# Patient Record
Sex: Male | Born: 1960 | Race: White | Hispanic: No | Marital: Single | Smoking: Never smoker
Health system: Southern US, Community
[De-identification: ages and names within clinical notes are randomized; demographics above are authoritative.]

## PROBLEM LIST (undated history)

## (undated) DIAGNOSIS — F419 Anxiety disorder, unspecified: Secondary | ICD-10-CM

## (undated) DIAGNOSIS — G4730 Sleep apnea, unspecified: Secondary | ICD-10-CM

## (undated) DIAGNOSIS — D869 Sarcoidosis, unspecified: Secondary | ICD-10-CM

## (undated) DIAGNOSIS — T7840XA Allergy, unspecified, initial encounter: Secondary | ICD-10-CM

## (undated) DIAGNOSIS — A159 Respiratory tuberculosis unspecified: Secondary | ICD-10-CM

## (undated) DIAGNOSIS — I10 Essential (primary) hypertension: Secondary | ICD-10-CM

## (undated) DIAGNOSIS — K219 Gastro-esophageal reflux disease without esophagitis: Secondary | ICD-10-CM

## (undated) DIAGNOSIS — Z227 Latent tuberculosis: Secondary | ICD-10-CM

## (undated) DIAGNOSIS — J45909 Unspecified asthma, uncomplicated: Secondary | ICD-10-CM

## (undated) HISTORY — PX: TONSILLECTOMY: SUR1361

## (undated) HISTORY — DX: Allergy, unspecified, initial encounter: T78.40XA

## (undated) HISTORY — DX: Anxiety disorder, unspecified: F41.9

## (undated) HISTORY — PX: EYE SURGERY: SHX253

## (undated) HISTORY — DX: Unspecified asthma, uncomplicated: J45.909

## (undated) HISTORY — DX: Gastro-esophageal reflux disease without esophagitis: K21.9

## (undated) HISTORY — DX: Respiratory tuberculosis unspecified: A15.9

## (undated) HISTORY — PX: APPENDECTOMY: SHX54

---

## 1997-04-17 DIAGNOSIS — Z227 Latent tuberculosis: Secondary | ICD-10-CM

## 1997-04-17 HISTORY — DX: Latent tuberculosis: Z22.7

## 2004-04-13 ENCOUNTER — Ambulatory Visit: Payer: Self-pay | Admitting: Gastroenterology

## 2007-04-18 HISTORY — PX: NASAL SEPTUM SURGERY: SHX37

## 2007-04-18 HISTORY — PX: OTHER SURGICAL HISTORY: SHX169

## 2007-09-12 ENCOUNTER — Encounter: Admission: RE | Admit: 2007-09-12 | Discharge: 2007-09-12 | Payer: Self-pay | Admitting: Otolaryngology

## 2009-02-10 ENCOUNTER — Ambulatory Visit: Payer: Self-pay | Admitting: Gastroenterology

## 2010-08-25 ENCOUNTER — Ambulatory Visit: Payer: Self-pay | Admitting: Unknown Physician Specialty

## 2015-03-31 ENCOUNTER — Encounter: Payer: Self-pay | Admitting: *Deleted

## 2015-04-01 ENCOUNTER — Ambulatory Visit: Payer: 59 | Admitting: Anesthesiology

## 2015-04-01 ENCOUNTER — Encounter
Admission: RE | Disposition: A | Discharge status: Home / Self Care | Payer: Self-pay | Source: Ambulatory Visit | Attending: Unknown Physician Specialty

## 2015-04-01 ENCOUNTER — Encounter: Payer: Self-pay | Admitting: *Deleted

## 2015-04-01 ENCOUNTER — Ambulatory Visit
Admission: RE | Admit: 2015-04-01 | Discharge: 2015-04-01 | Disposition: A | Discharge status: Home / Self Care | Payer: 59 | Source: Ambulatory Visit | Attending: Unknown Physician Specialty | Admitting: Unknown Physician Specialty

## 2015-04-01 DIAGNOSIS — G4730 Sleep apnea, unspecified: Secondary | ICD-10-CM | POA: Diagnosis not present

## 2015-04-01 DIAGNOSIS — Z79899 Other long term (current) drug therapy: Secondary | ICD-10-CM | POA: Insufficient documentation

## 2015-04-01 DIAGNOSIS — Z6830 Body mass index (BMI) 30.0-30.9, adult: Secondary | ICD-10-CM | POA: Insufficient documentation

## 2015-04-01 DIAGNOSIS — K648 Other hemorrhoids: Secondary | ICD-10-CM | POA: Insufficient documentation

## 2015-04-01 DIAGNOSIS — I10 Essential (primary) hypertension: Secondary | ICD-10-CM | POA: Insufficient documentation

## 2015-04-01 DIAGNOSIS — E669 Obesity, unspecified: Secondary | ICD-10-CM | POA: Diagnosis not present

## 2015-04-01 DIAGNOSIS — Z800 Family history of malignant neoplasm of digestive organs: Secondary | ICD-10-CM | POA: Diagnosis not present

## 2015-04-01 DIAGNOSIS — Z1211 Encounter for screening for malignant neoplasm of colon: Secondary | ICD-10-CM | POA: Insufficient documentation

## 2015-04-01 DIAGNOSIS — Z86010 Personal history of colonic polyps: Secondary | ICD-10-CM | POA: Insufficient documentation

## 2015-04-01 DIAGNOSIS — Z7982 Long term (current) use of aspirin: Secondary | ICD-10-CM | POA: Insufficient documentation

## 2015-04-01 HISTORY — PX: COLONOSCOPY WITH PROPOFOL: SHX5780

## 2015-04-01 HISTORY — DX: Latent tuberculosis: Z22.7

## 2015-04-01 HISTORY — DX: Sleep apnea, unspecified: G47.30

## 2015-04-01 HISTORY — DX: Sarcoidosis, unspecified: D86.9

## 2015-04-01 HISTORY — DX: Essential (primary) hypertension: I10

## 2015-04-01 SURGERY — COLONOSCOPY WITH PROPOFOL
Anesthesia: General

## 2015-04-01 MED ORDER — PROPOFOL 10 MG/ML IV BOLUS
INTRAVENOUS | Status: DC | PRN
  Administered 2015-04-01: 70 mg via INTRAVENOUS

## 2015-04-01 MED ORDER — MIDAZOLAM HCL 5 MG/5ML IJ SOLN
INTRAMUSCULAR | Status: DC | PRN
  Administered 2015-04-01: 1 mg via INTRAVENOUS

## 2015-04-01 MED ORDER — FENTANYL CITRATE (PF) 100 MCG/2ML IJ SOLN
INTRAMUSCULAR | Status: DC | PRN
Start: 2015-04-01 — End: 2015-04-01
  Administered 2015-04-01: 50 ug via INTRAVENOUS

## 2015-04-01 MED ORDER — SODIUM CHLORIDE 0.9 % IV SOLN
INTRAVENOUS | Status: DC
  Administered 2015-04-01: 10:00:00 via INTRAVENOUS

## 2015-04-01 MED ORDER — SODIUM CHLORIDE 0.9 % IV SOLN: INTRAVENOUS | Status: DC

## 2015-04-01 MED ORDER — LIDOCAINE HCL (CARDIAC) 20 MG/ML IV SOLN
INTRAVENOUS | Status: DC | PRN
  Administered 2015-04-01: 67 mg via INTRAVENOUS

## 2015-04-01 MED ORDER — PROPOFOL 500 MG/50ML IV EMUL
INTRAVENOUS | Status: DC | PRN
  Administered 2015-04-01: 120 ug/kg/min via INTRAVENOUS

## 2015-04-01 NOTE — H&P (Signed)
Primary Care Physician:  Pcp Not In System Primary Gastroenterologist:  Dr. Vira Agar  Pre-Procedure History & Physical: HPI:  Javier PAYE Sr. is a 54 y.o. male is here for an colonoscopy.   Past Medical History  Diagnosis Date  . Sleep apnea   . 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 Inactive TB 1999  . Hypertension     Past Surgical History  Procedure Laterality Date  . Tonsillectomy    . Appendectomy    . Pillar procedure  2009  . Nasal septum surgery  2009    Prior to Admission medications   Medication Sig Start Date End Date Taking? Authorizing Provider  albuterol (PROVENTIL HFA;VENTOLIN HFA) 108 (90 BASE) MCG/ACT inhaler Inhale into the lungs every 6 (six) hours as needed for wheezing or shortness of breath.   Yes Historical Provider, MD  aspirin 81 MG tablet Take 81 mg by mouth daily.   Yes Historical Provider, MD  buPROPion (WELLBUTRIN SR) 150 MG 12 hr tablet Take 150 mg by mouth daily.   Yes Historical Provider, MD  clorazepate (TRANXENE) 3.75 MG tablet Take 3.75 mg by mouth 2 (two) times daily as needed for anxiety.   Yes Historical Provider, MD  dicyclomine (BENTYL) 20 MG tablet Take 20 mg by mouth every 6 (six) hours.   Yes Historical Provider, MD  diphenoxylate-atropine (LOMOTIL) 2.5-0.025 MG tablet Take by mouth 4 (four) times daily as needed for diarrhea or loose stools.   Yes Historical Provider, MD  finasteride (PROPECIA) 1 MG tablet Take 1 mg by mouth daily.   Yes Historical Provider, MD  fluticasone (FLONASE) 50 MCG/ACT nasal spray Place 2 sprays into both nostrils daily.   Yes Historical Provider, MD  hyoscyamine (ANASPAZ) 0.125 MG TBDP disintergrating tablet Place 0.125 mg under the tongue every 6 (six) hours as needed.   Yes Historical Provider, MD  losartan-hydrochlorothiazide (HYZAAR) 100-25 MG tablet Take 1 tablet by mouth daily.   Yes Historical Provider, MD  methscopolamine (PAMINE FORTE) 5 MG tablet Take 5 mg by mouth at bedtime.   Yes Historical  Provider, MD  omeprazole (PRILOSEC) 10 MG capsule Take 10 mg by mouth daily.   Yes Historical Provider, MD  tadalafil (CIALIS) 5 MG tablet Take 5 mg by mouth daily as needed for erectile dysfunction.   Yes Historical Provider, MD  testosterone cypionate (DEPOTESTOSTERONE CYPIONATE) 200 MG/ML injection Inject into the muscle every 14 (fourteen) days.   Yes Historical Provider, MD    Allergies as of 03/09/2015  . (Not on File)    History reviewed. No pertinent family history.  Social History   Social History  . Marital Status: Single    Spouse Name: N/A  . Number of Children: N/A  . Years of Education: N/A   Occupational History  . Not on file.   Social History Main Topics  . Smoking status: Never Smoker   . Smokeless tobacco: Never Used  . Alcohol Use: Yes  . Drug Use: No  . Sexual Activity: Not on file   Other Topics Concern  . Not on file   Social History Narrative    Review of Systems: See HPI, otherwise negative ROS  Physical Exam: BP 136/91 mmHg  Pulse 88  Temp(Src) 98.6 F (37 C) (Tympanic)  Resp 20  Ht 6' (1.829 m)  Wt 102.059 kg (225 lb)  BMI 30.51 kg/m2  SpO2 98% General:   Alert,  pleasant and cooperative in NAD Head:  Normocephalic and atraumatic. Neck:  Supple; no masses  or thyromegaly. Lungs:  Clear throughout to auscultation.    Heart:  Regular rate and rhythm. Abdomen:  Soft, nontender and nondistended. Normal bowel sounds, without guarding, and without rebound.   Neurologic:  Alert and  oriented x4;  grossly normal neurologically.  Impression/Plan: Javier Levy Sr. is here for an colonoscopy to be performed for [facility] colon polyps and FH colon cancer in father  Risks, benefits, limitations, and alternatives regarding  colonoscopy have been reviewed with the patient.  Questions have been answered.  All parties agreeable.   Gaylyn Cheers, MD  04/01/2015, 11:01 AM

## 2015-04-01 NOTE — Anesthesia Preprocedure Evaluation (Signed)
Anesthesia Evaluation  Patient identified by MRN, date of birth, ID band Patient awake    Reviewed: Allergy & Precautions, NPO status , Patient's Chart, lab work & pertinent test results  Airway Mallampati: II       Dental  (+) Teeth Intact   Pulmonary sleep apnea and Continuous Positive Airway Pressure Ventilation ,    breath sounds clear to auscultation       Cardiovascular Exercise Tolerance: Good hypertension, Pt. on medications  Rhythm:Regular     Neuro/Psych negative neurological ROS     GI/Hepatic negative GI ROS, Neg liver ROS,   Endo/Other    Renal/GU negative Renal ROS     Musculoskeletal   Abdominal (+) + obese,   Peds  Hematology negative hematology ROS (+)   Anesthesia Other Findings   Reproductive/Obstetrics                             Anesthesia Physical Anesthesia Plan  ASA: II  Anesthesia Plan: General   Post-op Pain Management:    Induction: Intravenous  Airway Management Planned: Nasal Cannula  Additional Equipment:   Intra-op Plan:   Post-operative Plan:   Informed Consent: I have reviewed the patients History and Physical, chart, labs and discussed the procedure including the risks, benefits and alternatives for the proposed anesthesia with the patient or authorized representative who has indicated his/her understanding and acceptance.     Plan Discussed with: Surgeon  Anesthesia Plan Comments:         Anesthesia Quick Evaluation

## 2015-04-01 NOTE — Transfer of Care (Signed)
Immediate Anesthesia Transfer of Care Note  Patient: Javier HEFFINGTON Sr.  Procedure(s) Performed: Procedure(s): COLONOSCOPY WITH PROPOFOL (N/A)  Patient Location: PACU  Anesthesia Type:General  Level of Consciousness: sedated  Airway & Oxygen Therapy: Patient Spontanous Breathing and Patient connected to nasal cannula oxygen  Post-op Assessment: Report given to RN and Post -op Vital signs reviewed and stable  Post vital signs: Reviewed and stable  Last Vitals:  Filed Vitals:   04/01/15 1130 04/01/15 1131  BP: 92/47 92/47  Pulse: 64 67  Temp: 35.6 C 35.6 C  Resp: 17 15    Complications: No apparent anesthesia complications

## 2015-04-01 NOTE — Anesthesia Postprocedure Evaluation (Signed)
Anesthesia Post Note  Patient: Javier Martin.  Procedure(s) Performed: Procedure(s) (LRB): COLONOSCOPY WITH PROPOFOL (N/A)  Patient location during evaluation: PACU Anesthesia Type: General Level of consciousness: awake Pain management: satisfactory to patient Vital Signs Assessment: post-procedure vital signs reviewed and stable Respiratory status: nonlabored ventilation Cardiovascular status: stable Anesthetic complications: no    Last Vitals:  Filed Vitals:   04/01/15 1131 04/01/15 1140  BP: 92/47 104/76  Pulse: 67 75  Temp: 35.6 C   Resp: 15 18    Last Pain: There were no vitals filed for this visit.               VAN STAVEREN,Rani Idler

## 2015-04-01 NOTE — Op Note (Signed)
[facility] Patient Name: Javier Martin Procedure Date: 04/01/2015 11:02 AM MRN: [id number] Account #: [id number] Date of Birth: 1960/09/16 Admit Type: Outpatient Age: 54 Room: [facility] ENDO ROOM 1 Gender: Male Note Status: Finalized Procedure:         Colonoscopy Indications:       High risk colon cancer surveillance: Personal history of                     colonic polyps, Family history of colon cancer in a                     first-degree relative Providers:         Manya Silvas, MD Referring MD:      Janit Pagan, MD (Referring MD) Medicines:         Propofol per Anesthesia Complications:     No immediate complications. Procedure:         Pre-Anesthesia Assessment:                    - After reviewing the risks and benefits, the patient was                     deemed in satisfactory condition to undergo the procedure.                    After obtaining informed consent, the colonoscope was                     passed under direct vision. Throughout the procedure, the                     patient's blood pressure, pulse, and oxygen saturations                     were monitored continuously. The Colonoscope was                     introduced through the anus and advanced to the the cecum,                     identified by appendiceal orifice and ileocecal valve. The                     colonoscopy was performed without difficulty. The patient                     tolerated the procedure well. The quality of the bowel                     preparation was excellent. Findings:      Internal hemorrhoids were found during endoscopy. The hemorrhoids were       small and Grade I (internal hemorrhoids that do not prolapse).      The exam was otherwise without abnormality. Impression:        - Internal hemorrhoids.                    - The examination was otherwise normal.                    - No specimens collected. Recommendation:    - Repeat  colonoscopy in 5 years for screening purposes. Manya Silvas, MD 04/01/2015 11:29:41 AM This report has been signed electronically. Number  of Addenda: 0 Note Initiated On: 04/01/2015 11:02 AM Scope Withdrawal Time: 0 hours 8 minutes 5 seconds  Total Procedure Duration: 0 hours 14 minutes 10 seconds       [facility]

## 2015-04-03 ENCOUNTER — Encounter: Payer: Self-pay | Admitting: Unknown Physician Specialty

## 2019-02-13 DIAGNOSIS — D239 Other benign neoplasm of skin, unspecified: Secondary | ICD-10-CM

## 2019-02-13 HISTORY — DX: Other benign neoplasm of skin, unspecified: D23.9

## 2020-02-25 ENCOUNTER — Ambulatory Visit (INDEPENDENT_AMBULATORY_CARE_PROVIDER_SITE_OTHER): Payer: 59 | Admitting: Dermatology

## 2020-02-25 ENCOUNTER — Encounter: Payer: Self-pay | Admitting: Dermatology

## 2020-02-25 ENCOUNTER — Other Ambulatory Visit: Payer: Self-pay

## 2020-02-25 DIAGNOSIS — D225 Melanocytic nevi of trunk: Secondary | ICD-10-CM

## 2020-02-25 DIAGNOSIS — L730 Acne keloid: Secondary | ICD-10-CM | POA: Diagnosis not present

## 2020-02-25 DIAGNOSIS — Z1283 Encounter for screening for malignant neoplasm of skin: Secondary | ICD-10-CM | POA: Diagnosis not present

## 2020-02-25 DIAGNOSIS — L821 Other seborrheic keratosis: Secondary | ICD-10-CM

## 2020-02-25 DIAGNOSIS — Z86018 Personal history of other benign neoplasm: Secondary | ICD-10-CM | POA: Diagnosis not present

## 2020-02-25 DIAGNOSIS — L578 Other skin changes due to chronic exposure to nonionizing radiation: Secondary | ICD-10-CM

## 2020-02-25 DIAGNOSIS — D492 Neoplasm of unspecified behavior of bone, soft tissue, and skin: Secondary | ICD-10-CM

## 2020-02-25 DIAGNOSIS — L820 Inflamed seborrheic keratosis: Secondary | ICD-10-CM

## 2020-02-25 NOTE — Progress Notes (Signed)
Follow-Up Visit   Subjective  Javier Martin. is a 59 y.o. male who presents for the following: Rash (Pt c/o rash on the posterior neck for several years, treating with Clindamycin lotion with a good response ) and Follow-up (hx of Dyplastic nevus Left low back, 11cm lat. to spine. Mild atypia, removed in 2020 ).  He has an irritating spot on his leg he would like treated.  The following portions of the chart were reviewed this encounter and updated as appropriate:  Tobacco  Allergies  Meds  Problems  Med Hx  Surg Hx  Fam Hx     Review of Systems:  No other skin or systemic complaints except as noted in HPI or Assessment and Plan.  Objective  Well appearing patient in no apparent distress; mood and affect are within normal limits.  A focused examination was performed including face,chest,back,neck, legs . Relevant physical exam findings are noted in the Assessment and Plan.  Objective  Neck - Posterior: Erythema and minimal papules   Objective  Left lat pectoral: 0.6 cm irregular brown macule   Objective  Left calf: Erythematous keratotic or waxy stuck-on papule or plaque.   Objective  Left low back, 11cm lat. to spine.: Scar with no evidence of recurrence.    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 Cont Clindamycin solution prn   Neoplasm of skin Left lat pectoral  Epidermal / dermal shaving  Lesion diameter (cm):  0.6 Informed consent: discussed and consent obtained   Timeout: patient name, date of birth, surgical site, and procedure verified   Procedure prep:  Patient was prepped and draped in usual sterile fashion Prep type:  Isopropyl alcohol Anesthesia: the lesion was anesthetized in a standard fashion   Anesthetic:  1% lidocaine w/ epinephrine 1-100,000 buffered w/ 8.4% NaHCO3 Hemostasis achieved with: pressure, aluminum chloride and electrodesiccation     Outcome: patient tolerated procedure well   Post-procedure details: sterile dressing applied and wound care instructions given   Dressing type: bandage and petrolatum    Specimen 1 - Surgical pathology Differential Diagnosis: R/O Dysplastic nevus  Check Margins: No 0.6 cm irregular brown macule  Inflamed seborrheic keratosis Left calf  Destruction of lesion - Left calf Complexity: simple   Destruction method: cryotherapy   Informed consent: discussed and consent obtained   Timeout:  patient name, date of birth, surgical site, and procedure verified Lesion destroyed using liquid nitrogen: Yes   Region frozen until ice ball extended beyond lesion: Yes   Outcome: patient tolerated procedure well with no complications   Post-procedure details: wound care instructions given    History of dysplastic nevus Left low back, 11cm lat. to spine. Clear. Observe for recurrence. Call clinic for new or changing lesions.  Recommend regular skin exams, daily broad-spectrum spf 30+ sunscreen use, and photoprotection.    Actinic Damage - chronic, secondary to cumulative UV radiation exposure/sun exposure over time - diffuse scaly erythematous macules with underlying dyspigmentation - Recommend daily broad spectrum sunscreen SPF 30+ to sun-exposed areas, reapply every 2 hours as needed.  - Call for new or changing lesions.  Seborrheic Keratoses - Stuck-on, waxy, tan-brown papules and plaques  - Discussed benign etiology and prognosis. - Observe - Call for any changes  Return in about 1 year (around 02/24/2021).  Marta Lamas, CMA, am acting as scribe for Sarina Ser, MD .  Documentation: I have reviewed the above documentation  for accuracy and completeness, and I agree with the above.  Sarina Ser, MD

## 2020-02-25 NOTE — Patient Instructions (Signed)

## 2020-03-02 ENCOUNTER — Telehealth: Payer: Self-pay

## 2020-03-02 NOTE — Telephone Encounter (Signed)
LM on VM please return my call  

## 2020-03-02 NOTE — Telephone Encounter (Signed)
Pt informed of results. He has no concerns.

## 2020-03-02 NOTE — Telephone Encounter (Signed)
-----   Message from Ralene Bathe, MD sent at 03/01/2020  5:38 PM EST ----- Diagnosis Skin , left lat pectoral MELANOCYTIC NEVUS, COMPOUND TYPE  Benign mole

## 2020-05-19 DIAGNOSIS — R7989 Other specified abnormal findings of blood chemistry: Secondary | ICD-10-CM | POA: Insufficient documentation

## 2020-07-13 DIAGNOSIS — K589 Irritable bowel syndrome without diarrhea: Secondary | ICD-10-CM | POA: Insufficient documentation

## 2020-07-13 DIAGNOSIS — G4730 Sleep apnea, unspecified: Secondary | ICD-10-CM | POA: Insufficient documentation

## 2020-07-13 DIAGNOSIS — D869 Sarcoidosis, unspecified: Secondary | ICD-10-CM | POA: Insufficient documentation

## 2020-07-13 DIAGNOSIS — Z227 Latent tuberculosis: Secondary | ICD-10-CM | POA: Insufficient documentation

## 2020-07-13 DIAGNOSIS — F418 Other specified anxiety disorders: Secondary | ICD-10-CM | POA: Insufficient documentation

## 2020-07-13 DIAGNOSIS — K219 Gastro-esophageal reflux disease without esophagitis: Secondary | ICD-10-CM | POA: Insufficient documentation

## 2020-07-13 DIAGNOSIS — N486 Induration penis plastica: Secondary | ICD-10-CM | POA: Insufficient documentation

## 2020-07-13 DIAGNOSIS — Z6831 Body mass index (BMI) 31.0-31.9, adult: Secondary | ICD-10-CM | POA: Insufficient documentation

## 2020-07-13 DIAGNOSIS — R5382 Chronic fatigue, unspecified: Secondary | ICD-10-CM | POA: Insufficient documentation

## 2020-07-13 DIAGNOSIS — G4733 Obstructive sleep apnea (adult) (pediatric): Secondary | ICD-10-CM | POA: Insufficient documentation

## 2020-07-13 DIAGNOSIS — E291 Testicular hypofunction: Secondary | ICD-10-CM | POA: Insufficient documentation

## 2020-07-13 DIAGNOSIS — I10 Essential (primary) hypertension: Secondary | ICD-10-CM | POA: Insufficient documentation

## 2020-07-13 DIAGNOSIS — N529 Male erectile dysfunction, unspecified: Secondary | ICD-10-CM | POA: Insufficient documentation

## 2020-10-11 DIAGNOSIS — Z8639 Personal history of other endocrine, nutritional and metabolic disease: Secondary | ICD-10-CM | POA: Insufficient documentation

## 2020-10-11 DIAGNOSIS — J45909 Unspecified asthma, uncomplicated: Secondary | ICD-10-CM | POA: Insufficient documentation

## 2021-03-03 ENCOUNTER — Ambulatory Visit (INDEPENDENT_AMBULATORY_CARE_PROVIDER_SITE_OTHER): Payer: 59 | Admitting: Dermatology

## 2021-03-03 ENCOUNTER — Other Ambulatory Visit: Payer: Self-pay

## 2021-03-03 DIAGNOSIS — L821 Other seborrheic keratosis: Secondary | ICD-10-CM

## 2021-03-03 DIAGNOSIS — L578 Other skin changes due to chronic exposure to nonionizing radiation: Secondary | ICD-10-CM | POA: Diagnosis not present

## 2021-03-03 DIAGNOSIS — L814 Other melanin hyperpigmentation: Secondary | ICD-10-CM

## 2021-03-03 DIAGNOSIS — Z86018 Personal history of other benign neoplasm: Secondary | ICD-10-CM

## 2021-03-03 DIAGNOSIS — Z1283 Encounter for screening for malignant neoplasm of skin: Secondary | ICD-10-CM

## 2021-03-03 DIAGNOSIS — D489 Neoplasm of uncertain behavior, unspecified: Secondary | ICD-10-CM

## 2021-03-03 DIAGNOSIS — L820 Inflamed seborrheic keratosis: Secondary | ICD-10-CM

## 2021-03-03 DIAGNOSIS — L570 Actinic keratosis: Secondary | ICD-10-CM | POA: Diagnosis not present

## 2021-03-03 DIAGNOSIS — D2239 Melanocytic nevi of other parts of face: Secondary | ICD-10-CM | POA: Diagnosis not present

## 2021-03-03 DIAGNOSIS — L730 Acne keloid: Secondary | ICD-10-CM | POA: Diagnosis not present

## 2021-03-03 DIAGNOSIS — D1801 Hemangioma of skin and subcutaneous tissue: Secondary | ICD-10-CM

## 2021-03-03 MED ORDER — CLINDAMYCIN PHOSPHATE 1 % EX SWAB: 1.0000 "application " | CUTANEOUS | 11 refills | Status: DC

## 2021-03-03 NOTE — Progress Notes (Signed)
Follow-Up Visit   Subjective  Javier Martin. is a 60 y.o. male who presents for the following: Follow-up (Patient here today for 1 year tbse. Patient reports 2 spots on right leg and 1 spot on left leg. Patient is also here today for follow up on acne keloidalis nuchae at neck. ).  The following portions of the chart were reviewed this encounter and updated as appropriate:  Tobacco  Allergies  Meds  Problems  Med Hx  Surg Hx  Fam Hx     Review of Systems: No other skin or systemic complaints except as noted in HPI or Assessment and Plan.  Objective  Well appearing patient in no apparent distress; mood and affect are within normal limits.  A full examination was performed including scalp, head, eyes, ears, nose, lips, neck, chest, axillae, abdomen, back, buttocks, bilateral upper extremities, bilateral lower extremities, hands, feet, fingers, toes, fingernails, and toenails. All findings within normal limits unless otherwise noted below.  Neck - Posterior Erythema and minimal papules   Left Forehead 3 cm to superior brow 0.6 cm flesh colored papule        legs x 5 (5) Erythematous keratotic or waxy stuck-on papule or plaque.   face x 6 (6) Erythematous thin papules/macules with gritty scale.   Assessment & Plan  Acne keloidalis nuchae Neck - Posterior chronic but improved on treatment chronic AKN improved but not to goal Cont Clindamycin solution prn   clindamycin (CLEOCIN T) 1 % SWAB - Neck - Posterior Apply 1 application topically See admin instructions. 1 - 2 times daily prn for back of neck  Neoplasm of uncertain behavior Left Forehead 3 cm to superior brow Epidermal / dermal shaving  Lesion diameter (cm):  0.6 Informed consent: discussed and consent obtained   Timeout: patient name, date of birth, surgical site, and procedure verified   Procedure prep:  Patient was prepped and draped in usual sterile fashion Prep type:  Isopropyl  alcohol Anesthesia: the lesion was anesthetized in a standard fashion   Anesthetic:  1% lidocaine w/ epinephrine 1-100,000 buffered w/ 8.4% NaHCO3 Instrument used: flexible razor blade   Hemostasis achieved with: pressure, aluminum chloride and electrodesiccation   Outcome: patient tolerated procedure well   Post-procedure details: sterile dressing applied and wound care instructions given   Dressing type: bandage and petrolatum    Specimen 1 - Surgical pathology Differential Diagnosis: R/o sebaceous hyperplasia vs nevus vs bcc Check Margins: No R/o sebaceous hyperplasia vs nevus vs bcc  Inflamed seborrheic keratosis legs x  Destruction of lesion - legs x 5 Complexity: simple   Destruction method: cryotherapy   Informed consent: discussed and consent obtained   Timeout:  patient name, date of birth, surgical site, and procedure verified Lesion destroyed using liquid nitrogen: Yes   Region frozen until ice ball extended beyond lesion: Yes   Outcome: patient tolerated procedure well with no complications   Post-procedure details: wound care instructions given   Additional details:  Prior to procedure, discussed risks of blister formation, small wound, skin dyspigmentation, or rare scar following cryotherapy. Recommend Vaseline ointment to treated areas while healing.  Actinic keratosis (6) face x 6 Actinic keratoses are precancerous spots that appear secondary to cumulative UV radiation exposure/sun exposure over time. They are chronic with expected duration over 1 year. A portion of actinic keratoses will progress to squamous cell carcinoma of the skin. It is not possible to reliably predict which spots will progress to skin cancer and so  treatment is recommended to prevent development of skin cancer.  Recommend daily broad spectrum sunscreen SPF 30+ to sun-exposed areas, reapply every 2 hours as needed.  Recommend staying in the shade or wearing long sleeves, sun glasses (UVA+UVB  protection) and wide brim hats (4-inch brim around the entire circumference of the hat). Call for new or changing lesions.  Destruction of lesion - face x 6 Complexity: simple   Destruction method: cryotherapy   Informed consent: discussed and consent obtained   Timeout:  patient name, date of birth, surgical site, and procedure verified Lesion destroyed using liquid nitrogen: Yes   Region frozen until ice ball extended beyond lesion: Yes   Outcome: patient tolerated procedure well with no complications   Post-procedure details: wound care instructions given   Additional details:  Prior to procedure, discussed risks of blister formation, small wound, skin dyspigmentation, or rare scar following cryotherapy. Recommend Vaseline ointment to treated areas while healing.  Skin cancer screening  Lentigines - Scattered tan macules - Due to sun exposure - Benign-appearing, observe - Recommend daily broad spectrum sunscreen SPF 30+ to sun-exposed areas, reapply every 2 hours as needed. - Call for any changes  Seborrheic Keratoses - Stuck-on, waxy, tan-brown papules and/or plaques  - Benign-appearing - Discussed benign etiology and prognosis. - Observe - Call for any changes  Melanocytic Nevi - Tan-brown and/or pink-flesh-colored symmetric macules and papules - Benign appearing on exam today - Observation - Call clinic for new or changing moles - Recommend daily use of broad spectrum spf 30+ sunscreen to sun-exposed areas.   Hemangiomas - Red papules - Discussed benign nature - Observe - Call for any changes  Actinic Damage - Chronic condition, secondary to cumulative UV/sun exposure - diffuse scaly erythematous macules with underlying dyspigmentation - Recommend daily broad spectrum sunscreen SPF 30+ to sun-exposed areas, reapply every 2 hours as needed.  - Staying in the shade or wearing long sleeves, sun glasses (UVA+UVB protection) and wide brim hats (4-inch brim around the  entire circumference of the hat) are also recommended for sun protection.  - Call for new or changing lesions.  History of Dysplastic Nevi - No evidence of recurrence today Left low back, 11cm lat. to spine. - Recommend regular full body skin exams - Recommend daily broad spectrum sunscreen SPF 30+ to sun-exposed areas, reapply every 2 hours as needed.  - Call if any new or changing lesions are noted between office visits  Skin cancer screening performed today.  Return for 1 year tbse . IRuthell Rummage, CMA, am acting as scribe for Sarina Ser, MD. Documentation: I have reviewed the above documentation for accuracy and completeness, and I agree with the above.  Sarina Ser, MD

## 2021-03-03 NOTE — Patient Instructions (Addendum)
Biopsy Wound Care Instructions  Leave the original bandage on for 24 hours if possible.  If the bandage becomes soaked or soiled before that time, it is OK to remove it and examine the wound.  A small amount of post-operative bleeding is normal.  If excessive bleeding occurs, remove the bandage, place gauze over the site and apply continuous pressure (no peeking) over the area for 30 minutes. If this does not work, please call our clinic as soon as possible or page your doctor if it is after hours.   Once a day, cleanse the wound with soap and water. It is fine to shower. If a thick crust develops you may use a Q-tip dipped into dilute hydrogen peroxide (mix 1:1 with water) to dissolve it.  Hydrogen peroxide can slow the healing process, so use it only as needed.    After washing, apply petroleum jelly (Vaseline) or an antibiotic ointment if your doctor prescribed one for you, followed by a bandage.    For best healing, the wound should be covered with a layer of ointment at all times. If you are not able to keep the area covered with a bandage to hold the ointment in place, this may mean re-applying the ointment several times a day.  Continue this wound care until the wound has healed and is no longer open.   Itching and mild discomfort is normal during the healing process. However, if you develop pain or severe itching, please call our office.   If you have any discomfort, you can take Tylenol (acetaminophen) or ibuprofen as directed on the bottle. (Please do not take these if you have an allergy to them or cannot take them for another reason).  Some redness, tenderness and white or yellow material in the wound is normal healing.  If the area becomes very sore and red, or develops a thick yellow-green material (pus), it may be infected; please notify us.    If you have stitches, return to clinic as directed to have the stitches removed. You will continue wound care for 2-3 days after the stitches  are removed.   Wound healing continues for up to one year following surgery. It is not unusual to experience pain in the scar from time to time during the interval.  If the pain becomes severe or the scar thickens, you should notify the office.    A slight amount of redness in a scar is expected for the first six months.  After six months, the redness will fade and the scar will soften and fade.  The color difference becomes less noticeable with time.  If there are any problems, return for a post-op surgery check at your earliest convenience.  To improve the appearance of the scar, you can use silicone scar gel, cream, or sheets (such as Mederma or Serica) every night for up to one year. These are available over the counter (without a prescription).  Please call our office at [phone] for any questions or concerns.   Actinic keratoses are precancerous spots that appear secondary to cumulative UV radiation exposure/sun exposure over time. They are chronic with expected duration over 1 year. A portion of actinic keratoses will progress to squamous cell carcinoma of the skin. It is not possible to reliably predict which spots will progress to skin cancer and so treatment is recommended to prevent development of skin cancer.  Recommend daily broad spectrum sunscreen SPF 30+ to sun-exposed areas, reapply every 2 hours as needed.  Recommend  staying in the shade or wearing long sleeves, sun glasses (UVA+UVB protection) and wide brim hats (4-inch brim around the entire circumference of the hat). Call for new or changing lesions.   Cryotherapy Aftercare  Wash gently with soap and water everyday.   Apply Vaseline and Band-Aid daily until healed.          Melanoma ABCDEs  Melanoma is the most dangerous type of skin cancer, and is the leading cause of death from skin disease.  You are more likely to develop melanoma if you: Have light-colored skin, light-colored eyes, or red or blond  hair Spend a lot of time in the sun Tan regularly, either outdoors or in a tanning bed Have had blistering sunburns, especially during childhood Have a close family member who has had a melanoma Have atypical moles or large birthmarks  Early detection of melanoma is key since treatment is typically straightforward and cure rates are extremely high if we catch it early.   The first sign of melanoma is often a change in a mole or a new dark spot.  The ABCDE system is a way of remembering the signs of melanoma.  A for asymmetry:  The two halves do not match. B for border:  The edges of the growth are irregular. C for color:  A mixture of colors are present instead of an even brown color. D for diameter:  Melanomas are usually (but not always) greater than 54mm - the size of a pencil eraser. E for evolution:  The spot keeps changing in size, shape, and color.  Please check your skin once per month between visits. You can use a small mirror in front and a large mirror behind you to keep an eye on the back side or your body.   If you see any new or changing lesions before your next follow-up, please call to schedule a visit.  Please continue daily skin protection including broad spectrum sunscreen SPF 30+ to sun-exposed areas, reapplying every 2 hours as needed when you're outdoors.   Staying in the shade or wearing long sleeves, sun glasses (UVA+UVB protection) and wide brim hats (4-inch brim around the entire circumference of the hat) are also recommended for sun protection.    If You Need Anything After Your Visit  If you have any questions or concerns for your doctor, please call our main line at [phone] and press option 4 to reach your doctor's medical assistant. If no one answers, please leave a voicemail as directed and we will return your call as soon as possible. Messages left after 4 pm will be answered the following business day.   You may also send [place] a message via [place]. We  typically respond to MyChart messages within 1-2 business days.  For prescription refills, please ask your pharmacy to contact our office. Our fax number is [phone].  If you have an urgent issue when the clinic is closed that cannot wait until the next business day, you can page your doctor at the number below.    Please note that while we do our best to be available for urgent issues outside of office hours, we are not available 24/7.   If you have an urgent issue and are unable to reach [place], you may choose to seek medical care at your doctor's office, retail clinic, urgent care center, or emergency room.  If you have a medical emergency, please immediately call 911 or go to the emergency department.  Pager Numbers  -  Dr. Nehemiah Massed: [phone]  - Dr. Laurence Ferrari: [phone]  - Dr. Nicole Kindred: [phone]  In the event of inclement weather, please call our main line at [phone] for an update on the status of any delays or closures.  Dermatology Medication Tips: Please keep the boxes that topical medications come in in order to help keep track of the instructions about where and how to use these. Pharmacies typically print the medication instructions only on the boxes and not directly on the medication tubes.   If your medication is too expensive, please contact our office at [phone] option 4 or send [place] a message through [place].   We are unable to tell what your co-pay for medications will be in advance as this is different depending on your insurance coverage. However, we may be able to find a substitute medication at lower cost or fill out paperwork to get insurance to cover a needed medication.   If a prior authorization is required to get your medication covered by your insurance company, please allow [place] 1-2 business days to complete this process.  Drug prices often vary depending on where the prescription is filled and some pharmacies may offer cheaper prices.  The  website www.goodrx.com contains coupons for medications through different pharmacies. The prices here do not account for what the cost may be with help from insurance (it may be cheaper with your insurance), but the website can give you the price if you did not use any insurance.  - You can print the associated coupon and take it with your prescription to the pharmacy.  - You may also stop by our office during regular business hours and pick up a GoodRx coupon card.  - If you need your prescription sent electronically to a different pharmacy, notify our office through [facility] or by phone at [phone] option 4.     Si Usted Necesita Algo Despus de Su Visita  Tambin puede enviarnos un mensaje a travs de Pharmacist, community. Por lo general respondemos a los mensajes de MyChart en el transcurso de 1 a 2 das hbiles.  Para renovar recetas, por favor pida a su farmacia que se ponga en contacto con nuestra oficina. Harland Dingwall de fax es [place] [phone].  Si tiene un asunto urgente cuando la clnica est cerrada y que no puede esperar hasta el siguiente da hbil, puede llamar/localizar a su doctor(a) al nmero que aparece a continuacin.   Por favor, tenga en cuenta que aunque hacemos todo lo posible para estar disponibles para asuntos urgentes fuera del horario de [place], no estamos disponibles las 24 horas del da, los 7 das de la [place].   Si tiene un problema urgente y no puede comunicarse con nosotros, puede optar por buscar atencin mdica  en el consultorio de su doctor(a), en una clnica privada, en un centro de atencin urgente o en una sala de emergencias.  Si tiene Engineering geologist, por favor llame inmediatamente al 911 o vaya a la sala de emergencias.  Nmeros de bper  - Dr. Nehemiah Massed: [phone]  - Dra. Moye: [phone]  - Dra. Nicole Kindred: [phone]  En caso de inclemencias del [place], por favor llame a Johnsie Kindred principal al [phone] para una  actualizacin sobre el [place] de cualquier retraso o cierre.  Consejos para la medicacin en dermatologa: Por favor, guarde las cajas en las que vienen los medicamentos de uso tpico para ayudarle a seguir las instrucciones sobre dnde y cmo usarlos. [place] instrucciones del medicamento  slo en las cajas y no directamente en los tubos del medicamento.   Si su medicamento es muy caro, por favor, pngase en contacto con Zigmund Daniel llamando al [phone] y presione la opcin 4 o envenos un mensaje a travs de Pharmacist, community.   No podemos decirle cul ser su copago por los medicamentos por adelantado ya que esto es diferente dependiendo de la cobertura de su seguro. Sin embargo, es posible que podamos encontrar un medicamento sustituto a Electrical engineer un formulario para que el seguro cubra el medicamento que se considera necesario.   Si se requiere una autorizacin previa para que su compaa de seguros [place] su medicamento, por favor permtanos de 1 a 2 das hbiles para completar este proceso.  Los precios de los medicamentos varan con frecuencia dependiendo del Environmental consultant de dnde se surte la receta y alguna farmacias pueden ofrecer precios ms baratos.  El sitio web www.goodrx.com tiene cupones para medicamentos de Airline pilot. Los precios aqu no tienen en cuenta lo que podra costar con la ayuda del seguro (puede ser ms barato con su seguro), pero el sitio web puede darle el precio si no utiliz Research scientist (physical sciences).  - Puede imprimir el cupn correspondiente y llevarlo con su receta a la farmacia.  - Tambin puede pasar por nuestra oficina durante el horario de atencin regular y Charity fundraiser una tarjeta de cupones de GoodRx.  - Si necesita que su receta se enve electrnicamente a una farmacia diferente, informe a nuestra oficina a travs de MyChart de [place] o por telfono llamando al [phone] y presione la opcin 4.

## 2021-03-07 ENCOUNTER — Telehealth: Payer: Self-pay

## 2021-03-07 NOTE — Telephone Encounter (Signed)
Advised patient of results/hd  

## 2021-03-07 NOTE — Telephone Encounter (Signed)
-----   Message from Ralene Bathe, MD sent at 03/05/2021  2:41 PM EST ----- Diagnosis Skin , left forehead 3 cm to superior brow MELANOCYTIC NEVUS, INTRADERMAL TYPE, IRRITATED  Benign irritated mole No further treatment needed

## 2021-03-18 ENCOUNTER — Encounter: Payer: Self-pay | Admitting: Dermatology

## 2021-08-17 ENCOUNTER — Ambulatory Visit
Admission: RE | Admit: 2021-08-17 | Discharge: 2021-08-17 | Disposition: A | Discharge status: Home / Self Care | Payer: 59 | Attending: Family Medicine | Admitting: Family Medicine

## 2021-08-17 ENCOUNTER — Ambulatory Visit
Admission: RE | Admit: 2021-08-17 | Discharge: 2021-08-17 | Disposition: A | Discharge status: Home / Self Care | Payer: 59 | Source: Ambulatory Visit | Attending: Family Medicine | Admitting: Family Medicine

## 2021-08-17 ENCOUNTER — Other Ambulatory Visit: Payer: Self-pay | Admitting: Family Medicine

## 2021-08-17 DIAGNOSIS — R0602 Shortness of breath: Secondary | ICD-10-CM

## 2022-03-07 ENCOUNTER — Ambulatory Visit (INDEPENDENT_AMBULATORY_CARE_PROVIDER_SITE_OTHER): Payer: 59 | Admitting: Dermatology

## 2022-03-07 DIAGNOSIS — C44319 Basal cell carcinoma of skin of other parts of face: Secondary | ICD-10-CM | POA: Diagnosis not present

## 2022-03-07 DIAGNOSIS — L814 Other melanin hyperpigmentation: Secondary | ICD-10-CM | POA: Diagnosis not present

## 2022-03-07 DIAGNOSIS — Z86018 Personal history of other benign neoplasm: Secondary | ICD-10-CM

## 2022-03-07 DIAGNOSIS — L821 Other seborrheic keratosis: Secondary | ICD-10-CM

## 2022-03-07 DIAGNOSIS — D485 Neoplasm of uncertain behavior of skin: Secondary | ICD-10-CM

## 2022-03-07 DIAGNOSIS — L570 Actinic keratosis: Secondary | ICD-10-CM

## 2022-03-07 DIAGNOSIS — L578 Other skin changes due to chronic exposure to nonionizing radiation: Secondary | ICD-10-CM | POA: Diagnosis not present

## 2022-03-07 DIAGNOSIS — D229 Melanocytic nevi, unspecified: Secondary | ICD-10-CM

## 2022-03-07 DIAGNOSIS — Z1283 Encounter for screening for malignant neoplasm of skin: Secondary | ICD-10-CM | POA: Diagnosis not present

## 2022-03-07 DIAGNOSIS — C44310 Basal cell carcinoma of skin of unspecified parts of face: Secondary | ICD-10-CM

## 2022-03-07 DIAGNOSIS — C4491 Basal cell carcinoma of skin, unspecified: Secondary | ICD-10-CM

## 2022-03-07 HISTORY — DX: Basal cell carcinoma of skin, unspecified: C44.91

## 2022-03-07 NOTE — Patient Instructions (Addendum)
Electrodesiccation and Curettage ("Scrape and Burn") Wound Care Instructions  Leave the original bandage on for 24 hours if possible.  If the bandage becomes soaked or soiled before that time, it is OK to remove it and examine the wound.  A small amount of post-operative bleeding is normal.  If excessive bleeding occurs, remove the bandage, place gauze over the site and apply continuous pressure (no peeking) over the area for 30 minutes. If this does not work, please call our clinic as soon as possible or page your doctor if it is after hours.   Once a day, cleanse the wound with soap and water. It is fine to shower. If a thick crust develops you may use a Q-tip dipped into dilute hydrogen peroxide (mix 1:1 with water) to dissolve it.  Hydrogen peroxide can slow the healing process, so use it only as needed.    After washing, apply petroleum jelly (Vaseline) or an antibiotic ointment if your doctor prescribed one for you, followed by a bandage.    For best healing, the wound should be covered with a layer of ointment at all times. If you are not able to keep the area covered with a bandage to hold the ointment in place, this may mean re-applying the ointment several times a day.  Continue this wound care until the wound has healed and is no longer open. It may take several weeks for the wound to heal and close.  Itching and mild discomfort is normal during the healing process.  If you have any discomfort, you can take Tylenol (acetaminophen) or ibuprofen as directed on the bottle. (Please do not take these if you have an allergy to them or cannot take them for another reason).  Some redness, tenderness and white or yellow material in the wound is normal healing.  If the area becomes very sore and red, or develops a thick yellow-green material (pus), it may be infected; please notify us.    Wound healing continues for up to one year following surgery. It is not unusual to experience pain in the scar  from time to time during the interval.  If the pain becomes severe or the scar thickens, you should notify the office.    A slight amount of redness in a scar is expected for the first six months.  After six months, the redness will fade and the scar will soften and fade.  The color difference becomes less noticeable with time.  If there are any problems, return for a post-op surgery check at your earliest convenience.  To improve the appearance of the scar, you can use silicone scar gel, cream, or sheets (such as Mederma or Serica) every night for up to one year. These are available over the counter (without a prescription).  Please call our office at [phone] for any questions or concerns.     Due to recent changes in healthcare laws, you may see results of your pathology and/or laboratory studies on MyChart before the doctors have had a chance to review them. We understand that in some cases there may be results that are confusing or concerning to you. Please understand that not all results are received at the same time and often the doctors may need to interpret multiple results in order to provide you with the best plan of care or course of treatment. Therefore, we ask that you please give us 2 business days to thoroughly review all your results before contacting the office for clarification. Should   we see a critical lab result, you will be contacted sooner.   If You Need Anything After Your Visit  If you have any questions or concerns for your doctor, please call our main line at [phone] and press option 4 to reach your doctor's medical assistant. If no one answers, please leave a voicemail as directed and we will return your call as soon as possible. Messages left after 4 pm will be answered the following business day.   You may also send us a message via MyChart. We typically respond to MyChart messages within 1-2 business days.  For prescription refills, please ask your  pharmacy to contact our office. Our fax number is [phone].  If you have an urgent issue when the clinic is closed that cannot wait until the next business day, you can page your doctor at the number below.    Please note that while we do our best to be available for urgent issues outside of office hours, we are not available 24/7.   If you have an urgent issue and are unable to reach us, you may choose to seek medical care at your doctor's office, retail clinic, urgent care center, or emergency room.  If you have a medical emergency, please immediately call 911 or go to the emergency department.  Pager Numbers  - Dr. Kowalski: [phone]  - Dr. Moye: [phone]  - Dr. Stewart: [phone]  In the event of inclement weather, please call our main line at [phone] for an update on the status of any delays or closures.  Dermatology Medication Tips: Please keep the boxes that topical medications come in in order to help keep track of the instructions about where and how to use these. Pharmacies typically print the medication instructions only on the boxes and not directly on the medication tubes.   If your medication is too expensive, please contact our office at [phone] option 4 or send us a message through MyChart.   We are unable to tell what your co-pay for medications will be in advance as this is different depending on your insurance coverage. However, we may be able to find a substitute medication at lower cost or fill out paperwork to get insurance to cover a needed medication.   If a prior authorization is required to get your medication covered by your insurance company, please allow us 1-2 business days to complete this process.  Drug prices often vary depending on where the prescription is filled and some pharmacies may offer cheaper prices.  The website www.goodrx.com contains coupons for medications through different pharmacies. The prices here do not  account for what the cost may be with help from insurance (it may be cheaper with your insurance), but the website can give you the price if you did not use any insurance.  - You can print the associated coupon and take it with your prescription to the pharmacy.  - You may also stop by our office during regular business hours and pick up a GoodRx coupon card.  - If you need your prescription sent electronically to a different pharmacy, notify our office through [place] MyChart or by phone at [phone] option 4.     Si Usted Necesita Algo Despus de Su Visita  Tambin puede enviarnos un mensaje a travs de MyChart. Por lo general respondemos a los mensajes de MyChart en el transcurso de 1 a 2 das hbiles.  Para renovar recetas, por favor pida a su farmacia que se ponga en   contacto con nuestra oficina. Nuestro nmero de fax es el [phone].  Si tiene un asunto urgente cuando la clnica est cerrada y que no puede esperar hasta el siguiente da hbil, puede llamar/localizar a su doctor(a) al nmero que aparece a continuacin.   Por favor, tenga en cuenta que aunque hacemos todo lo posible para estar disponibles para asuntos urgentes fuera del horario de oficina, no estamos disponibles las 24 horas del da, los 7 das de la semana.   Si tiene un problema urgente y no puede comunicarse con nosotros, puede optar por buscar atencin mdica  en el consultorio de su doctor(a), en una clnica privada, en un centro de atencin urgente o en una sala de emergencias.  Si tiene una emergencia mdica, por favor llame inmediatamente al 911 o vaya a la sala de emergencias.  Nmeros de bper  - Dr. Kowalski: [phone]  - Dra. Moye: [phone]  - Dra. Stewart: [phone]  En caso de inclemencias del tiempo, por favor llame a nuestra lnea principal al [phone] para una actualizacin sobre el estado de cualquier retraso o cierre.  Consejos para la medicacin en dermatologa: Por  favor, guarde las cajas en las que vienen los medicamentos de uso tpico para ayudarle a seguir las instrucciones sobre dnde y cmo usarlos. Las farmacias generalmente imprimen las instrucciones del medicamento slo en las cajas y no directamente en los tubos del medicamento.   Si su medicamento es muy caro, por favor, pngase en contacto con nuestra oficina llamando al [phone] y presione la opcin 4 o envenos un mensaje a travs de MyChart.   No podemos decirle cul ser su copago por los medicamentos por adelantado ya que esto es diferente dependiendo de la cobertura de su seguro. Sin embargo, es posible que podamos encontrar un medicamento sustituto a menor costo o llenar un formulario para que el seguro cubra el medicamento que se considera necesario.   Si se requiere una autorizacin previa para que su compaa de seguros cubra su medicamento, por favor permtanos de 1 a 2 das hbiles para completar este proceso.  Los precios de los medicamentos varan con frecuencia dependiendo del lugar de dnde se surte la receta y alguna farmacias pueden ofrecer precios ms baratos.  El sitio web www.goodrx.com tiene cupones para medicamentos de diferentes farmacias. Los precios aqu no tienen en cuenta lo que podra costar con la ayuda del seguro (puede ser ms barato con su seguro), pero el sitio web puede darle el precio si no utiliz ningn seguro.  - Puede imprimir el cupn correspondiente y llevarlo con su receta a la farmacia.  - Tambin puede pasar por nuestra oficina durante el horario de atencin regular y recoger una tarjeta de cupones de GoodRx.  - Si necesita que su receta se enve electrnicamente a una farmacia diferente, informe a nuestra oficina a travs de MyChart de  o por telfono llamando al [phone] y presione la opcin 4.  

## 2022-03-07 NOTE — Progress Notes (Signed)
Follow-Up Visit   Subjective  Javier BLYDEN Sr. is a 61 y.o. male who presents for the following: Annual Exam (Hx dysplastic nevus - pt's girl friend noticed an irregular lesion on his cheek and he would like it checked today). The patient presents for Total-Body Skin Exam (TBSE) for skin cancer screening and mole check.  The patient has spots, moles and lesions to be evaluated, some may be new or changing and the patient has concerns that these could be cancer.  The following portions of the chart were reviewed this encounter and updated as appropriate:   Tobacco  Allergies  Meds  Problems  Med Hx  Surg Hx  Fam Hx     Review of Systems:  No other skin or systemic complaints except as noted in HPI or Assessment and Plan.  Objective  Well appearing patient in no apparent distress; mood and affect are within normal limits.  A full examination was performed including scalp, head, eyes, ears, nose, lips, neck, chest, axillae, abdomen, back, buttocks, bilateral upper extremities, bilateral lower extremities, hands, feet, fingers, toes, fingernails, and toenails. All findings within normal limits unless otherwise noted below.  B/L temple x 2 (2) Erythematous thin papules/macules with gritty scale.   R inf cheek 0.5 cm flesh colored papule.    Assessment & Plan  AK (actinic keratosis) (2) B/L temple x 2  Destruction of lesion - B/L temple x 2 Complexity: simple   Destruction method: cryotherapy   Informed consent: discussed and consent obtained   Timeout:  patient name, date of birth, surgical site, and procedure verified Lesion destroyed using liquid nitrogen: Yes   Region frozen until ice ball extended beyond lesion: Yes   Outcome: patient tolerated procedure well with no complications   Post-procedure details: wound care instructions given    Neoplasm of uncertain behavior of skin R inf cheek  Epidermal / dermal shaving  Lesion diameter (cm):  0.5 Informed consent:  discussed and consent obtained   Timeout: patient name, date of birth, surgical site, and procedure verified   Procedure prep:  Patient was prepped and draped in usual sterile fashion Prep type:  Isopropyl alcohol Anesthesia: the lesion was anesthetized in a standard fashion   Anesthetic:  1% lidocaine w/ epinephrine 1-100,000 buffered w/ 8.4% NaHCO3 Instrument used: flexible razor blade   Hemostasis achieved with: pressure, aluminum chloride and electrodesiccation   Outcome: patient tolerated procedure well   Post-procedure details: sterile dressing applied and wound care instructions given   Dressing type: bandage and petrolatum    Destruction of lesion Complexity: extensive   Destruction method: electrodesiccation and curettage   Informed consent: discussed and consent obtained   Timeout:  patient name, date of birth, surgical site, and procedure verified Procedure prep:  Patient was prepped and draped in usual sterile fashion Prep type:  Isopropyl alcohol Anesthesia: the lesion was anesthetized in a standard fashion   Anesthetic:  1% lidocaine w/ epinephrine 1-100,000 buffered w/ 8.4% NaHCO3 Curettage performed in three different directions: Yes   Electrodesiccation performed over the curetted area: Yes   Lesion length (cm):  0.5 Lesion width (cm):  0.5 Margin per side (cm):  0.2 Final wound size (cm):  0.9 Hemostasis achieved with:  pressure, aluminum chloride and electrodesiccation Outcome: patient tolerated procedure well with no complications   Post-procedure details: sterile dressing applied and wound care instructions given   Dressing type: bandage and petrolatum    Specimen 1 - Surgical pathology Differential Diagnosis: D48.5 cyst vs  sebaceous hyperplasia r/o CA ED&C today Check Margins: No  Lentigines - Scattered tan macules - Due to sun exposure - Benign-appearing, observe - Recommend daily broad spectrum sunscreen SPF 30+ to sun-exposed areas, reapply every 2  hours as needed. - Call for any changes  Seborrheic Keratoses - Stuck-on, waxy, tan-brown papules and/or plaques  - Benign-appearing - Discussed benign etiology and prognosis. - Observe - Call for any changes  Melanocytic Nevi - Tan-brown and/or pink-flesh-colored symmetric macules and papules - Benign appearing on exam today - Observation - Call clinic for new or changing moles - Recommend daily use of broad spectrum spf 30+ sunscreen to sun-exposed areas.   Hemangiomas - Red papules - Discussed benign nature - Observe - Call for any changes  Actinic Damage - Chronic condition, secondary to cumulative UV/sun exposure - diffuse scaly erythematous macules with underlying dyspigmentation - Recommend daily broad spectrum sunscreen SPF 30+ to sun-exposed areas, reapply every 2 hours as needed.  - Staying in the shade or wearing long sleeves, sun glasses (UVA+UVB protection) and wide brim hats (4-inch brim around the entire circumference of the hat) are also recommended for sun protection.  - Call for new or changing lesions.  History of Dysplastic Nevi  - No evidence of recurrence today - Recommend regular full body skin exams - Recommend daily broad spectrum sunscreen SPF 30+ to sun-exposed areas, reapply every 2 hours as needed.  - Call if any new or changing lesions are noted between office visits  Skin cancer screening performed today.  Return in about 1 year (around 03/08/2023) for TBSE.  Luther Redo, CMA, am acting as scribe for Sarina Ser, MD . Documentation: I have reviewed the above documentation for accuracy and completeness, and I agree with the above.  Sarina Ser, MD

## 2022-03-16 ENCOUNTER — Telehealth: Payer: Self-pay

## 2022-03-16 NOTE — Telephone Encounter (Signed)
-----   Message from Ralene Bathe, MD sent at 03/15/2022 12:51 PM EST ----- Diagnosis Skin , right inf cheek BASAL CELL CARCINOMA, NODULAR PATTERN  Cancer - BCC Already treated Recheck next visit

## 2022-03-16 NOTE — Telephone Encounter (Signed)
Advised patient of results/hd  

## 2022-03-26 ENCOUNTER — Encounter: Payer: Self-pay | Admitting: Dermatology

## 2022-11-16 DIAGNOSIS — M7711 Lateral epicondylitis, right elbow: Secondary | ICD-10-CM | POA: Insufficient documentation

## 2023-01-26 IMAGING — CR DG CHEST 2V
4 series · 4 of 4 positions shown · non-contrast
Comparison: None

CLINICAL DATA: 61-year-old male with a history of shortness of
breath

EXAM:
CHEST - 2 VIEW

[chest pa (1 of 2)]
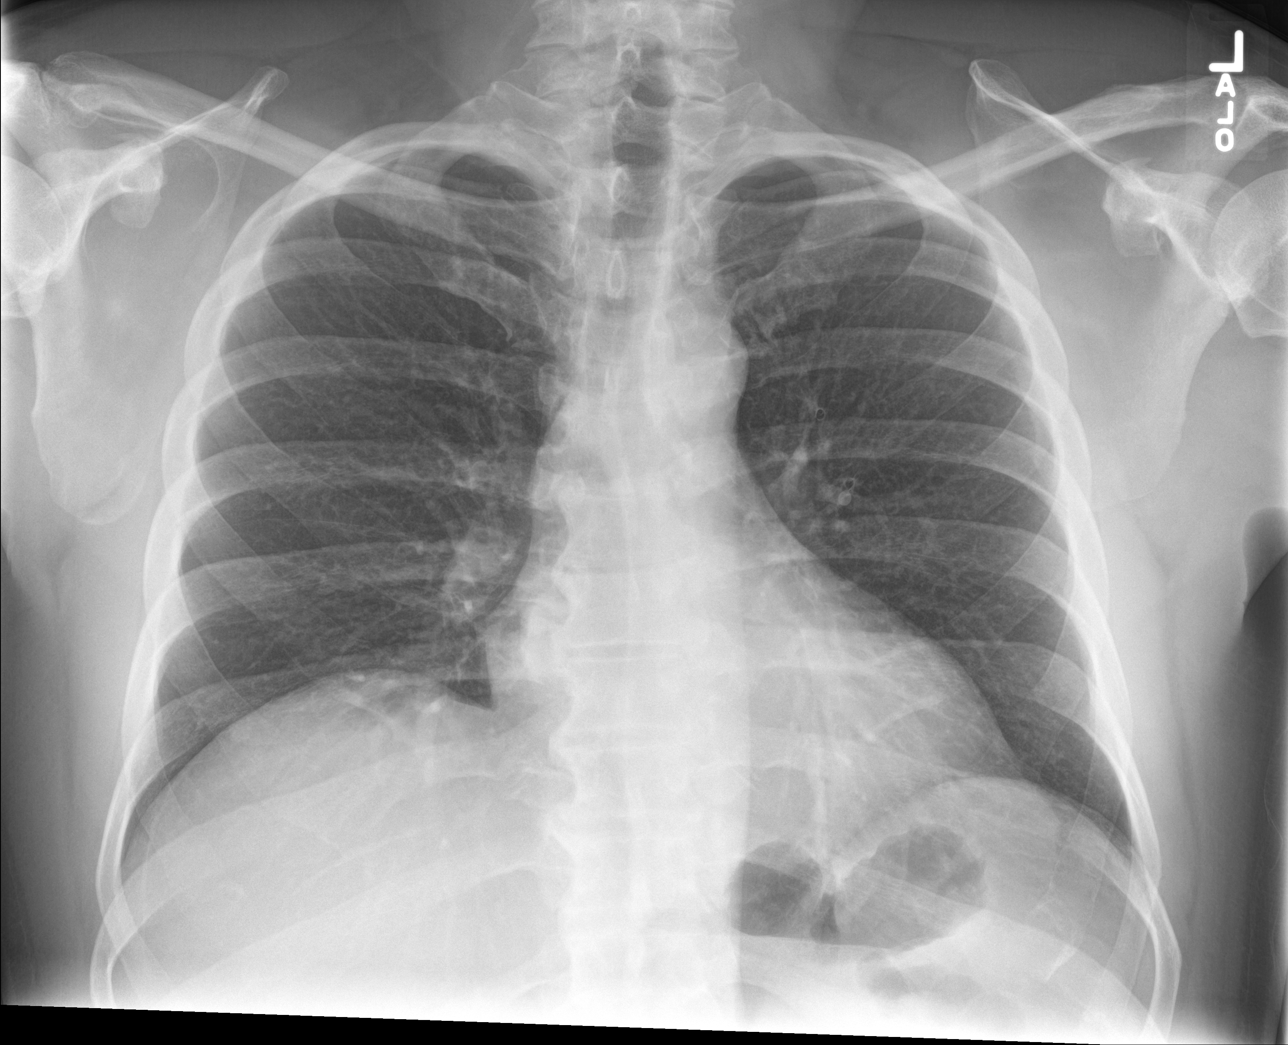

[chest lat (1 of 2)]
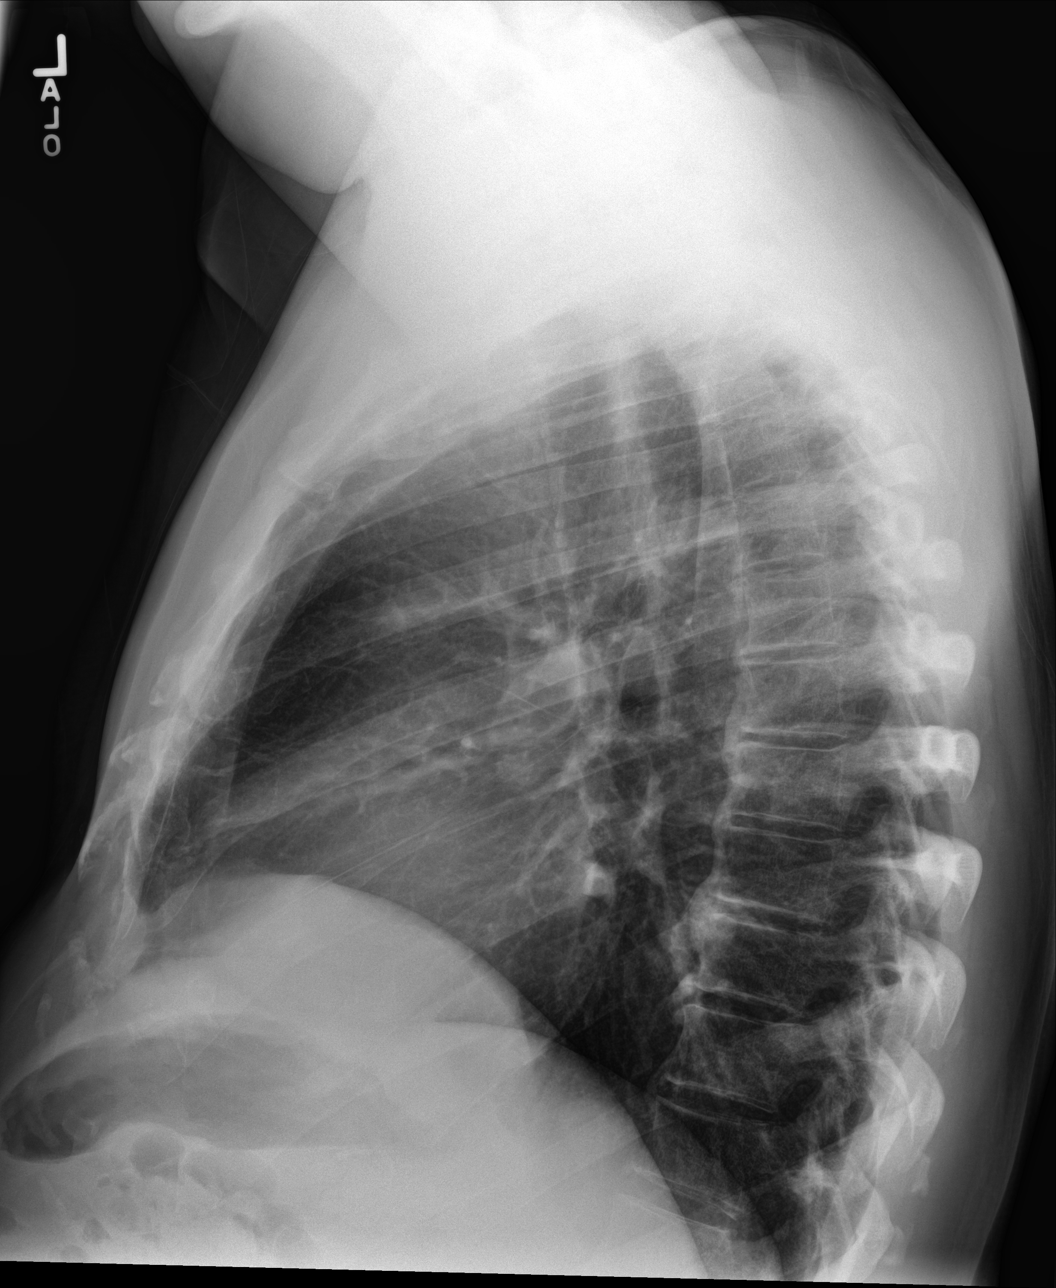

[chest pa (2 of 2)]
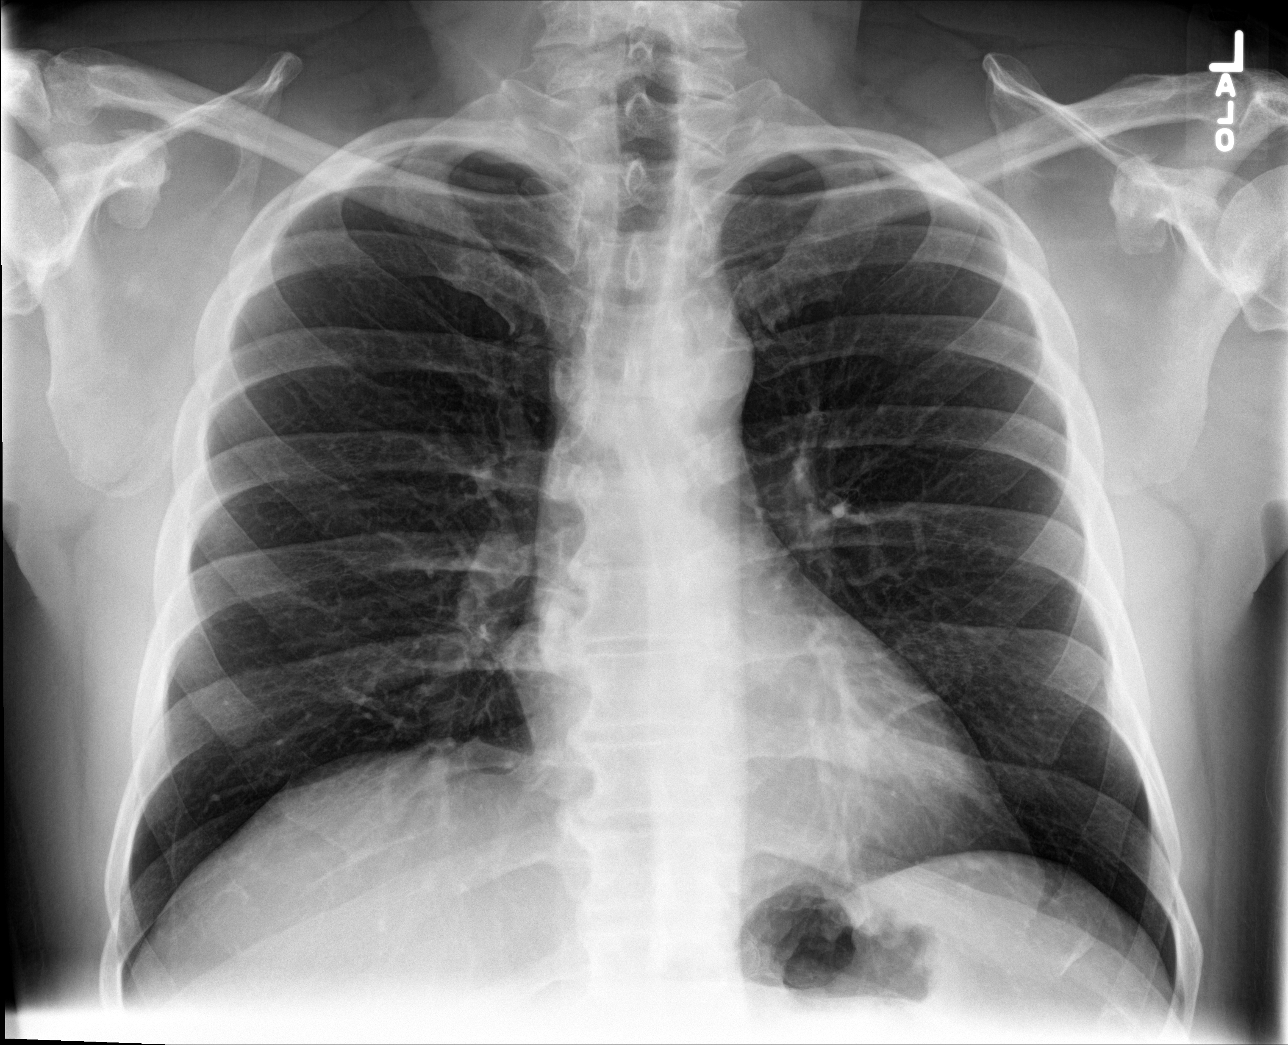

[chest lat (2 of 2)]
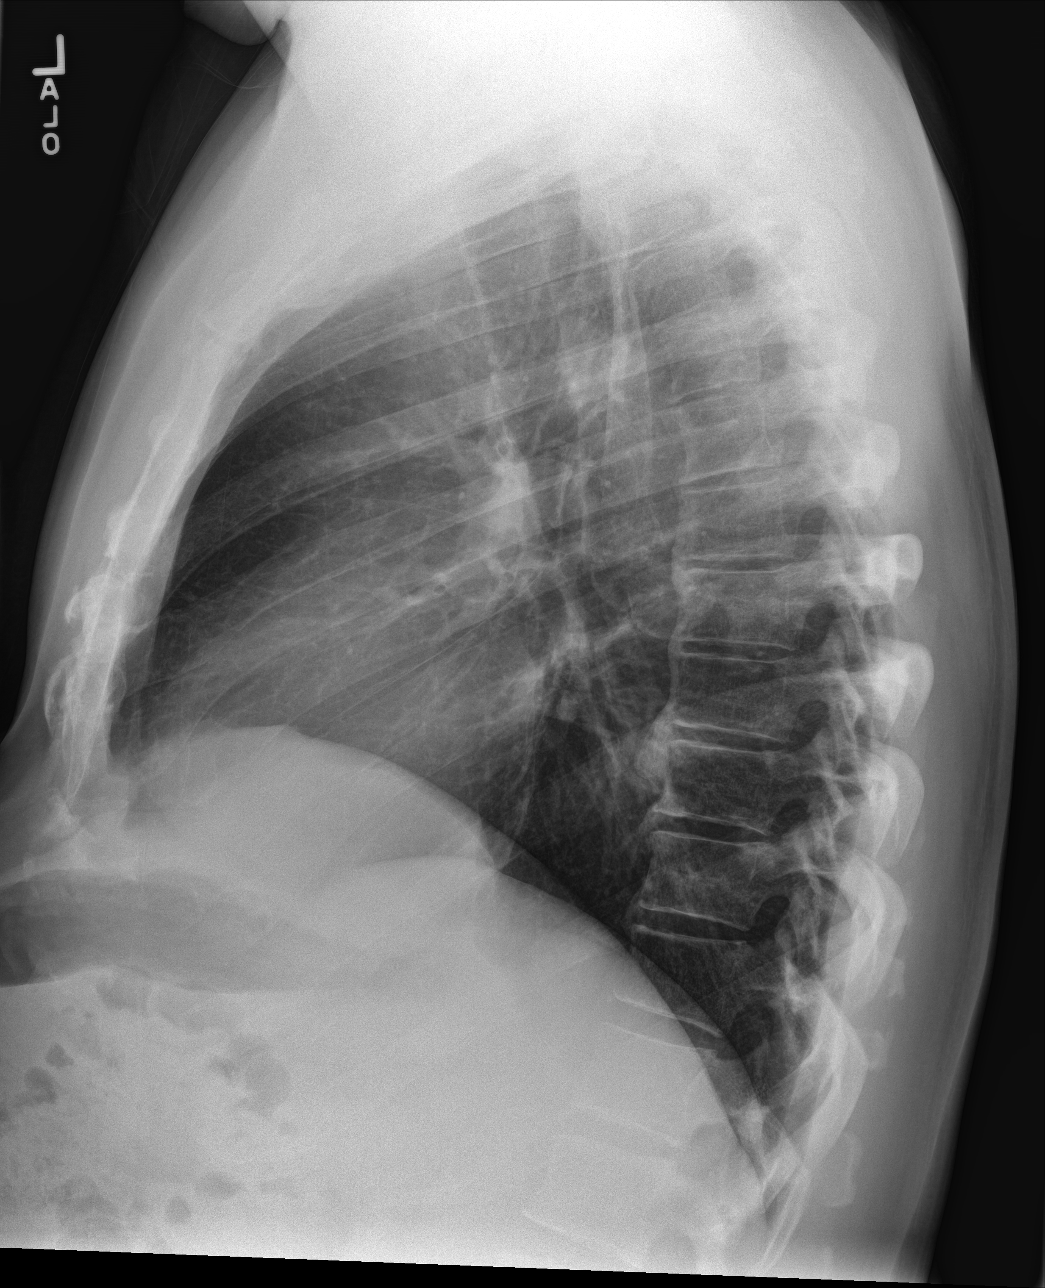

[4 of 4 positions shown; findings below may reference images not displayed]

FINDINGS: Cardiomediastinal silhouette unchanged in size and contour. No
evidence of central vascular congestion. No interlobular septal
thickening.

No pneumothorax or pleural effusion. Coarsened interstitial
markings, with no confluent airspace disease.

No acute displaced fracture. Degenerative changes of the spine.
IMPRESSION: No active cardiopulmonary disease.

## 2023-01-29 ENCOUNTER — Encounter (INDEPENDENT_AMBULATORY_CARE_PROVIDER_SITE_OTHER): Payer: Self-pay | Admitting: Nurse Practitioner

## 2023-01-30 ENCOUNTER — Other Ambulatory Visit: Payer: Self-pay | Admitting: Nephrology

## 2023-01-30 DIAGNOSIS — R3915 Urgency of urination: Secondary | ICD-10-CM

## 2023-02-07 ENCOUNTER — Ambulatory Visit
Admission: RE | Admit: 2023-02-07 | Discharge: 2023-02-07 | Disposition: A | Discharge status: Home / Self Care | Payer: 59 | Source: Ambulatory Visit | Attending: Nephrology | Admitting: Nephrology

## 2023-02-07 DIAGNOSIS — R3915 Urgency of urination: Secondary | ICD-10-CM | POA: Insufficient documentation

## 2023-02-21 ENCOUNTER — Encounter (INDEPENDENT_AMBULATORY_CARE_PROVIDER_SITE_OTHER): Payer: Self-pay | Admitting: Nurse Practitioner

## 2023-03-13 ENCOUNTER — Encounter (INDEPENDENT_AMBULATORY_CARE_PROVIDER_SITE_OTHER): Payer: Self-pay | Admitting: Nurse Practitioner

## 2023-03-28 ENCOUNTER — Ambulatory Visit (INDEPENDENT_AMBULATORY_CARE_PROVIDER_SITE_OTHER): Payer: 59 | Admitting: Dermatology

## 2023-03-28 DIAGNOSIS — Z7189 Other specified counseling: Secondary | ICD-10-CM

## 2023-03-28 DIAGNOSIS — D1801 Hemangioma of skin and subcutaneous tissue: Secondary | ICD-10-CM

## 2023-03-28 DIAGNOSIS — D229 Melanocytic nevi, unspecified: Secondary | ICD-10-CM

## 2023-03-28 DIAGNOSIS — W908XXA Exposure to other nonionizing radiation, initial encounter: Secondary | ICD-10-CM | POA: Diagnosis not present

## 2023-03-28 DIAGNOSIS — L858 Other specified epidermal thickening: Secondary | ICD-10-CM

## 2023-03-28 DIAGNOSIS — Z1283 Encounter for screening for malignant neoplasm of skin: Secondary | ICD-10-CM

## 2023-03-28 DIAGNOSIS — Z79899 Other long term (current) drug therapy: Secondary | ICD-10-CM

## 2023-03-28 DIAGNOSIS — L570 Actinic keratosis: Secondary | ICD-10-CM

## 2023-03-28 DIAGNOSIS — L821 Other seborrheic keratosis: Secondary | ICD-10-CM

## 2023-03-28 DIAGNOSIS — L730 Acne keloid: Secondary | ICD-10-CM

## 2023-03-28 DIAGNOSIS — L578 Other skin changes due to chronic exposure to nonionizing radiation: Secondary | ICD-10-CM | POA: Diagnosis not present

## 2023-03-28 DIAGNOSIS — L918 Other hypertrophic disorders of the skin: Secondary | ICD-10-CM

## 2023-03-28 DIAGNOSIS — L814 Other melanin hyperpigmentation: Secondary | ICD-10-CM

## 2023-03-28 DIAGNOSIS — L820 Inflamed seborrheic keratosis: Secondary | ICD-10-CM

## 2023-03-28 DIAGNOSIS — Z86018 Personal history of other benign neoplasm: Secondary | ICD-10-CM

## 2023-03-28 DIAGNOSIS — Z85828 Personal history of other malignant neoplasm of skin: Secondary | ICD-10-CM

## 2023-03-28 MED ORDER — CLINDAMYCIN PHOSPHATE 1 % EX SWAB: 1.0000 | CUTANEOUS | 11 refills | Status: DC

## 2023-03-28 NOTE — Patient Instructions (Signed)

## 2023-03-28 NOTE — Progress Notes (Signed)
Follow-Up Visit   Subjective  Javier RADON Sr. is a 62 y.o. male who presents for the following: Skin Cancer Screening and Full Body Skin Exam  The patient presents for Total-Body Skin Exam (TBSE) for skin cancer screening and mole check. The patient has spots, moles and lesions to be evaluated, some may be new or changing and the patient may have concern these could be cancer.   The following portions of the chart were reviewed this encounter and updated as appropriate: medications, allergies, medical history  Review of Systems:  No other skin or systemic complaints except as noted in HPI or Assessment and Plan.  Objective  Well appearing patient in no apparent distress; mood and affect are within normal limits.  A full examination was performed including scalp, head, eyes, ears, nose, lips, neck, chest, axillae, abdomen, back, buttocks, bilateral upper extremities, bilateral lower extremities, hands, feet, fingers, toes, fingernails, and toenails. All findings within normal limits unless otherwise noted below.   Relevant physical exam findings are noted in the Assessment and Plan.  L temple x 2 (2) Erythematous thin papules/macules with gritty scale.  L upper back paraspinal x 1, L lower eyelid x 1 (2) Erythematous keratotic or waxy stuck-on papule or plaque.  R axilla x 2, L axilla x 2 (4) Fleshy, skin-colored pedunculated papules.     Assessment & Plan   SKIN CANCER SCREENING PERFORMED TODAY.  ACTINIC DAMAGE - Chronic condition, secondary to cumulative UV/sun exposure - diffuse scaly erythematous macules with underlying dyspigmentation - Recommend daily broad spectrum sunscreen SPF 30+ to sun-exposed areas, reapply every 2 hours as needed.  - Staying in the shade or wearing long sleeves, sun glasses (UVA+UVB protection) and wide brim hats (4-inch brim around the entire circumference of the hat) are also recommended for sun protection.  - Call for new or changing  lesions.  LENTIGINES, SEBORRHEIC KERATOSES, HEMANGIOMAS - Benign normal skin lesions - Benign-appearing - Call for any changes  MELANOCYTIC NEVI - Tan-brown and/or pink-flesh-colored symmetric macules and papules - Benign appearing on exam today - Observation - Call clinic for new or changing moles - Recommend daily use of broad spectrum spf 30+ sunscreen to sun-exposed areas.   HISTORY OF BASAL CELL CARCINOMA OF THE SKIN - R inf cheek - No evidence of recurrence today - Recommend regular full body skin exams - Recommend daily broad spectrum sunscreen SPF 30+ to sun-exposed areas, reapply every 2 hours as needed.  - Call if any new or changing lesions are noted between office visits  HISTORY OF DYSPLASTIC NEVUS - L low back 11.0 cm lat to spine No evidence of recurrence today Recommend regular full body skin exams Recommend daily broad spectrum sunscreen SPF 30+ to sun-exposed areas, reapply every 2 hours as needed.  Call if any new or changing lesions are noted between office visits  AK (ACTINIC KERATOSIS) (2) L temple x 2 (2) Actinic keratoses are precancerous spots that appear secondary to cumulative UV radiation exposure/sun exposure over time. They are chronic with expected duration over 1 year. A portion of actinic keratoses will progress to squamous cell carcinoma of the skin. It is not possible to reliably predict which spots will progress to skin cancer and so treatment is recommended to prevent development of skin cancer.  Recommend daily broad spectrum sunscreen SPF 30+ to sun-exposed areas, reapply every 2 hours as needed.  Recommend staying in the shade or wearing long sleeves, sun glasses (UVA+UVB protection) and wide brim hats (4-inch  brim around the entire circumference of the hat). Call for new or changing lesions.  Destruction of lesion - L temple x 2 (2) Complexity: simple   Destruction method: cryotherapy   Informed consent: discussed and consent obtained    Timeout:  patient name, date of birth, surgical site, and procedure verified Lesion destroyed using liquid nitrogen: Yes   Region frozen until ice ball extended beyond lesion: Yes   Outcome: patient tolerated procedure well with no complications   Post-procedure details: wound care instructions given   INFLAMED SEBORRHEIC KERATOSIS (2) L upper back paraspinal x 1, L lower eyelid x 1 (2) Symptomatic, irritating, patient would like treated.  Destruction of lesion - L upper back paraspinal x 1, L lower eyelid x 1 (2) Complexity: simple   Destruction method: cryotherapy   Informed consent: discussed and consent obtained   Timeout:  patient name, date of birth, surgical site, and procedure verified Lesion destroyed using liquid nitrogen: Yes   Region frozen until ice ball extended beyond lesion: Yes   Outcome: patient tolerated procedure well with no complications   Post-procedure details: wound care instructions given   SKIN TAG (4) R axilla x 2, L axilla x 2 (4) Symptomatic, irritating, patient would like treated.  Destruction of lesion - R axilla x 2, L axilla x 2 (4) Complexity: simple   Destruction method: cryotherapy   Informed consent: discussed and consent obtained   Timeout:  patient name, date of birth, surgical site, and procedure verified Lesion destroyed using liquid nitrogen: Yes   Region frozen until ice ball extended beyond lesion: Yes   Outcome: patient tolerated procedure well with no complications   Post-procedure details: wound care instructions given   ACNE KELOIDALIS NUCHAE   Related Medications clindamycin (CLEOCIN T) 1 % SWAB Apply 1 application  topically See admin instructions. 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 Currently well-controlled. Cont Clindamycin solution prn   KERATOSIS PILARIS - Tiny follicular keratotic papules - Benign. Genetic  in nature. No cure. - Observe. - If desired, patient can use an emollient (moisturizer) containing ammonium lactate (AmLactin), urea or salicylic acid once a day to smooth the area  Recommend starting moisturizer with exfoliant (Urea, Salicylic acid, or Lactic acid) one to two times daily to help smooth rough and bumpy skin.  OTC options include Cetaphil Rough and Bumpy lotion (Urea), Eucerin Roughness Relief lotion or spot treatment cream (Urea), CeraVe SA lotion/cream for Rough and Bumpy skin (Sal Acid), Gold Bond Rough and Bumpy cream (Sal Acid), and AmLactin 12% lotion/cream (Lactic Acid).  If applying in morning, also apply sunscreen to sun-exposed areas, since these exfoliating moisturizers can increase sensitivity to sun.   Return in about 1 year (around 03/27/2024) for TBSE.  Maylene Roes, CMA, am acting as scribe for Armida Sans, MD .   Documentation: I have reviewed the above documentation for accuracy and completeness, and I agree with the above.  Armida Sans, MD

## 2023-04-01 ENCOUNTER — Encounter: Payer: Self-pay | Admitting: Dermatology

## 2023-04-16 ENCOUNTER — Encounter (INDEPENDENT_AMBULATORY_CARE_PROVIDER_SITE_OTHER): Payer: Self-pay | Admitting: Nurse Practitioner

## 2023-04-16 ENCOUNTER — Ambulatory Visit (INDEPENDENT_AMBULATORY_CARE_PROVIDER_SITE_OTHER): Payer: 59 | Admitting: Nurse Practitioner

## 2023-04-16 VITALS — BP 132/81 | HR 87 | Resp 16 | Wt 221.4 lb

## 2023-04-16 DIAGNOSIS — R600 Localized edema: Secondary | ICD-10-CM

## 2023-04-16 NOTE — Progress Notes (Signed)
Subjective:    Patient ID: Javier Baseman Sr., male    DOB: 03-05-1961, 62 y.o.   MRN: [id number] Chief Complaint  Patient presents with   New Patient (Initial Visit)    Ref [place] consult ble edema    Javier Martin is a 62 year old male who presents today for evaluation of bilateral lower extremity edema.  The patient notes that in the month of September he takes out the months for fishing.  He notes that he typically has swelling during this timeframe in his feet.  It extends from below his knee down to the foot area.  He notes that with his last visiting trip the swelling was the worst it has ever been and it has actually become painful with the stretching of the skin.  He notes that he typically would drink beer during the fishing trips but at his last fishing trip he did not drink any beer.  He has not had any open wounds or ulcerations.  Today the swelling has largely resolved and he has none present.  However he notes that when he goes on these fishing trips he stands in a boat primarily for about 8 hours at a time.  He has worn compression but not consistently.  He has also begun to note that recently when he is traveling for periods of 3-1/2 to 4 hours he also begins to have swelling in his feet as well.  Typically day-to-day he works at [employer].  He recently had a evaluation by nephrology which showed no evidence of kidney dysfunction.    Review of Systems  Cardiovascular:  Positive for leg swelling.  All other systems reviewed and are negative.      Objective:   Physical Exam Vitals reviewed.  HENT:     Head: Normocephalic.  Cardiovascular:     Rate and Rhythm: Normal rate.     Pulses: Normal pulses.  Pulmonary:     Effort: Pulmonary effort is normal.  Musculoskeletal:     Right lower leg: No edema.     Left lower leg: No edema.  Skin:    General: Skin is warm and dry.  Neurological:     Mental Status: He is alert and oriented to person, place, and time.   Psychiatric:        Mood and Affect: Mood normal.        Behavior: Behavior normal.        Thought Content: Thought content normal.        Judgment: Judgment normal.     BP 132/81   Pulse 87   Resp 16   Wt 221 lb 6.4 oz (100.4 kg)   BMI 30.03 kg/m   Past Medical History:  Diagnosis Date   Basal cell carcinoma 03/07/2022   Right inf cheek - [facility]   Dysplastic nevus 02/13/2019   Left low back, 11cm lat. to spine. Mild atypia, peripheral and deep margins involved.    Hypertension    Inactive TB 1999   Sarcoidosis 1997-2000   Sleep apnea     Social History   Socioeconomic History   Marital status: Single    Spouse name: Not on file   Number of children: Not on file   Years of education: Not on file   Highest education level: Not on file  Occupational History   Not on file  Tobacco Use   Smoking status: Never   Smokeless tobacco: Never  Substance and Sexual Activity   Alcohol use:  Yes   Drug use: No   Sexual activity: Not on file  Other Topics Concern   Not on file  Social History Narrative   Not on file   Social Drivers of Health   Financial Resource Strain: Not on file  Food Insecurity: Not on file  Transportation Needs: Not on file  Physical Activity: Not on file  Stress: Not on file  Social Connections: Not on file  Intimate Partner Violence: Not on file    Past Surgical History:  Procedure Laterality Date   APPENDECTOMY     COLONOSCOPY WITH PROPOFOL N/A 04/01/2015   Procedure: COLONOSCOPY WITH PROPOFOL;  Surgeon: Scot Jun, MD;  [facility] ENDOSCOPY;  Service: Endoscopy;  Laterality: N/A;   NASAL SEPTUM SURGERY  2009   pillar procedure  2009   TONSILLECTOMY      Family History  Problem Relation Age of Onset   Hypertension Mother    Stroke Mother    Hypertension Father    Colon cancer Father    Arthritis Brother    Hypertension Brother     Allergies  Allergen Reactions   Nutritional Supplements Hives   Red Dye    Milk (Cow)  Other (See Comments)    Other Reaction: GI Upset  Other reaction(s): Other (See Comments)  Other Reaction: GI Upset  Other Reaction: GI Upset    Other reaction(s): Other (See Comments) Other Reaction: GI Upset        No data to display            CMP  No results found for: "NA", "K", "CL", "CO2", "GLUCOSE", "BUN", "CREATININE", "CALCIUM", "PROT", "ALBUMIN", "AST", "ALT", "ALKPHOS", "BILITOT", "GFR", "EGFR", "GFRNONAA"   No results found.     Assessment & Plan:   1. Bilateral leg edema (Primary) I suspect that what the patient has is some venous insufficiency.  I had a long discussion with the patient regarding the nature of venous insufficiency as well as possible treatment options.  We also discussed recommended the use of medical grade compression socks.  The patient she is doing to 30 mmHg medical socks to be worn daily especially during his fishing trips.  He should place him in the morning and remove them before he rest for the evening.  He is also advised to elevate his lower extremities.  Will have the patient return his convenience for bilateral venous reflux studies.   Current Outpatient Medications on File Prior to Visit  Medication Sig Dispense Refill   albuterol (PROVENTIL HFA;VENTOLIN HFA) 108 (90 BASE) MCG/ACT inhaler Inhale into the lungs every 6 (six) hours as needed for wheezing or shortness of breath.     buPROPion (WELLBUTRIN SR) 150 MG 12 hr tablet Take 150 mg by mouth daily.     chlorthalidone (HYGROTON) 25 MG tablet Take 25 mg by mouth daily.     clindamycin (CLEOCIN T) 1 % SWAB Apply 1 application  topically See admin instructions. 1 - 2 times daily prn for back of neck 60 each 11   finasteride (PROPECIA) 1 MG tablet Take 1 mg by mouth daily.     hyoscyamine (ANASPAZ) 0.125 MG TBDP disintergrating tablet Place 0.125 mg under the tongue every 6 (six) hours as needed.     lisinopril (ZESTRIL) 20 MG tablet Take 20 mg by mouth daily.     tadalafil (CIALIS)  5 MG tablet Take 5 mg by mouth daily as needed for erectile dysfunction.     clorazepate (TRANXENE) 3.75 MG tablet  Take 3.75 mg by mouth 2 (two) times daily as needed for anxiety.     dicyclomine (BENTYL) 20 MG tablet Take 20 mg by mouth every 6 (six) hours.     diphenoxylate-atropine (LOMOTIL) 2.5-0.025 MG tablet Take by mouth 4 (four) times daily as needed for diarrhea or loose stools.     fluticasone (FLONASE) 50 MCG/ACT nasal spray Place 2 sprays into both nostrils daily.     losartan-hydrochlorothiazide (HYZAAR) 100-25 MG tablet Take 1 tablet by mouth daily.     methscopolamine (PAMINE FORTE) 5 MG tablet Take 5 mg by mouth at bedtime.     omeprazole (PRILOSEC) 10 MG capsule Take 10 mg by mouth daily.     testosterone cypionate (DEPOTESTOSTERONE CYPIONATE) 200 MG/ML injection Inject into the muscle every 14 (fourteen) days.     No current facility-administered medications on file prior to visit.    There are no Patient Instructions on file for this visit. No follow-ups on file.   Georgiana Spinner, NP

## 2023-05-01 ENCOUNTER — Other Ambulatory Visit (INDEPENDENT_AMBULATORY_CARE_PROVIDER_SITE_OTHER): Payer: Self-pay | Admitting: Nurse Practitioner

## 2023-05-01 DIAGNOSIS — R600 Localized edema: Secondary | ICD-10-CM

## 2023-05-05 DIAGNOSIS — K219 Gastro-esophageal reflux disease without esophagitis: Secondary | ICD-10-CM | POA: Insufficient documentation

## 2023-05-05 DIAGNOSIS — I10 Essential (primary) hypertension: Secondary | ICD-10-CM | POA: Insufficient documentation

## 2023-05-05 DIAGNOSIS — I890 Lymphedema, not elsewhere classified: Secondary | ICD-10-CM | POA: Insufficient documentation

## 2023-05-05 DIAGNOSIS — J45909 Unspecified asthma, uncomplicated: Secondary | ICD-10-CM | POA: Insufficient documentation

## 2023-05-05 NOTE — Progress Notes (Unsigned)
MRN : [id number]  Javier BONAFEDE Sr. is a 63 y.o. (1960-10-01) male who presents with chief complaint of legs swell.  History of Present Illness:   The patient returns to the office for followup evaluation regarding leg swelling.  The swelling has persisted and the pain associated with swelling continues. There have not been any interval development of a ulcerations or wounds.  Since the previous visit the patient has been wearing graduated compression stockings and has noted little if any improvement in the lymphedema. The patient has been using compression routinely morning until night.  The patient also states elevation during the day and exercise is being done too.  No outpatient medications have been marked as taking for the 05/07/23 encounter (Appointment) with Gilda Crease, Latina Craver, MD.    Past Medical History:  Diagnosis Date   Basal cell carcinoma 03/07/2022   Right inf cheek - [facility]   Dysplastic nevus 02/13/2019   Left low back, 11cm lat. to spine. Mild atypia, peripheral and deep margins involved.    Hypertension    Inactive TB 1999   Sarcoidosis 1997-2000   Sleep apnea     Past Surgical History:  Procedure Laterality Date   APPENDECTOMY     COLONOSCOPY WITH PROPOFOL N/A 04/01/2015   Procedure: COLONOSCOPY WITH PROPOFOL;  Surgeon: Scot Jun, MD;  Location: [facility] ENDOSCOPY;  Service: Endoscopy;  Laterality: N/A;   NASAL SEPTUM SURGERY  2009   pillar procedure  2009   TONSILLECTOMY      Social History Social History   Tobacco Use   Smoking status: Never   Smokeless tobacco: Never  Substance Use Topics   Alcohol use: Yes   Drug use: No    Family History Family History  Problem Relation Age of Onset   Hypertension Mother    Stroke Mother    Hypertension Father    Colon cancer Father    Arthritis Brother    Hypertension Brother     Allergies  Allergen Reactions   Nutritional Supplements Hives   Red Dye    Milk  (Cow) Other (See Comments)    Other Reaction: GI Upset  Other reaction(s): Other (See Comments)  Other Reaction: GI Upset  Other Reaction: GI Upset    Other reaction(s): Other (See Comments) Other Reaction: GI Upset     REVIEW OF SYSTEMS (Negative unless checked)  Constitutional: [] Weight loss  [] Fever  [] Chills Cardiac: [] Chest pain   [] Chest pressure   [] Palpitations   [] Shortness of breath when laying flat   [] Shortness of breath with exertion. Vascular:  [] Pain in legs with walking   [x] Pain in legs with standing  [] History of DVT   [] Phlebitis   [x] Swelling in legs   [] Varicose veins   [] Non-healing ulcers Pulmonary:   [] Uses home oxygen   [] Productive cough   [] Hemoptysis   [] Wheeze  [] COPD   [] Asthma Neurologic:  [] Dizziness   [] Seizures   [] History of stroke   [] History of TIA  [] Aphasia   [] Vissual changes   [] Weakness or numbness in arm   [] Weakness or numbness in leg Musculoskeletal:   [] Joint swelling   [] Joint pain   [] Low back pain Hematologic:  [] Easy bruising  [] Easy bleeding   [] Hypercoagulable state   [] Anemic Gastrointestinal:  [] Diarrhea   [] Vomiting  [] Gastroesophageal reflux/heartburn   [] Difficulty swallowing. Genitourinary:  []   Chronic kidney disease   [] Difficult urination  [] Frequent urination   [] Blood in urine Skin:  [] Rashes   [] Ulcers  Psychological:  [] History of anxiety   []  History of major depression.  Physical Examination  There were no vitals filed for this visit. There is no height or weight on file to calculate BMI. Gen: WD/WN, NAD Head: /AT, No temporalis wasting.  Ear/Nose/Throat: Hearing grossly intact, nares w/o erythema or drainage, pinna without lesions Eyes: PER, EOMI, sclera nonicteric.  Neck: Supple, no gross masses.  No JVD.  Pulmonary:  Good air movement, no audible wheezing, no use of accessory muscles.  Cardiac: RRR, precordium not hyperdynamic. Vascular:  scattered varicosities present bilaterally.  Mild venous stasis changes  to the legs bilaterally.  3-4+ soft pitting edema, CEAP [id number]  Vessel Right Left  Radial Palpable Palpable  Gastrointestinal: soft, non-distended. No guarding/no peritoneal signs.  Musculoskeletal: M/S 5/5 throughout.  No deformity.  Neurologic: CN 2-12 intact. Pain and light touch intact in extremities.  Symmetrical.  Speech is fluent. Motor exam as listed above. Psychiatric: Judgment intact, Mood & affect appropriate for pt's clinical situation. Dermatologic: Venous rashes no ulcers noted.  No changes consistent with cellulitis. Lymph : No lichenification or skin changes of chronic lymphedema.  CBC No results found for: "WBC", "HGB", "HCT", "MCV", "PLT"  BMET No results found for: "NA", "K", "CL", "CO2", "GLUCOSE", "BUN", "CREATININE", "CALCIUM", "GFRNONAA", "GFRAA" CrCl cannot be calculated (No successful lab value found.).  COAG No results found for: "INR", "PROTIME"  Radiology No results found.   Assessment/Plan There are no diagnoses linked to this encounter.   Levora Dredge, MD  05/05/2023 2:32 PM

## 2023-05-07 ENCOUNTER — Ambulatory Visit (INDEPENDENT_AMBULATORY_CARE_PROVIDER_SITE_OTHER): Payer: 59 | Admitting: Vascular Surgery

## 2023-05-07 ENCOUNTER — Ambulatory Visit (INDEPENDENT_AMBULATORY_CARE_PROVIDER_SITE_OTHER): Payer: 59

## 2023-05-07 ENCOUNTER — Encounter (INDEPENDENT_AMBULATORY_CARE_PROVIDER_SITE_OTHER): Payer: Self-pay | Admitting: Vascular Surgery

## 2023-05-07 VITALS — BP 129/79 | HR 84 | Resp 16 | Wt 223.0 lb

## 2023-05-07 DIAGNOSIS — I890 Lymphedema, not elsewhere classified: Secondary | ICD-10-CM | POA: Diagnosis not present

## 2023-05-07 DIAGNOSIS — K219 Gastro-esophageal reflux disease without esophagitis: Secondary | ICD-10-CM

## 2023-05-07 DIAGNOSIS — I10 Essential (primary) hypertension: Secondary | ICD-10-CM | POA: Diagnosis not present

## 2023-05-07 DIAGNOSIS — R600 Localized edema: Secondary | ICD-10-CM | POA: Diagnosis not present

## 2023-05-07 DIAGNOSIS — J45909 Unspecified asthma, uncomplicated: Secondary | ICD-10-CM | POA: Diagnosis not present

## 2023-05-31 DIAGNOSIS — I10 Essential (primary) hypertension: Secondary | ICD-10-CM | POA: Insufficient documentation

## 2023-05-31 DIAGNOSIS — C4491 Basal cell carcinoma of skin, unspecified: Secondary | ICD-10-CM | POA: Insufficient documentation

## 2023-08-13 ENCOUNTER — Encounter: Payer: Self-pay | Admitting: Physician Assistant

## 2023-08-13 ENCOUNTER — Ambulatory Visit (INDEPENDENT_AMBULATORY_CARE_PROVIDER_SITE_OTHER): Payer: Self-pay | Admitting: Physician Assistant

## 2023-08-13 VITALS — BP 136/70 | HR 73 | Temp 98.6°F | Ht 72.0 in | Wt 226.0 lb

## 2023-08-13 DIAGNOSIS — K580 Irritable bowel syndrome with diarrhea: Secondary | ICD-10-CM | POA: Diagnosis not present

## 2023-08-13 DIAGNOSIS — K219 Gastro-esophageal reflux disease without esophagitis: Secondary | ICD-10-CM

## 2023-08-13 DIAGNOSIS — N486 Induration penis plastica: Secondary | ICD-10-CM

## 2023-08-13 DIAGNOSIS — G2581 Restless legs syndrome: Secondary | ICD-10-CM | POA: Diagnosis not present

## 2023-08-13 DIAGNOSIS — G4733 Obstructive sleep apnea (adult) (pediatric): Secondary | ICD-10-CM

## 2023-08-13 DIAGNOSIS — Z860101 Personal history of adenomatous and serrated colon polyps: Secondary | ICD-10-CM | POA: Insufficient documentation

## 2023-08-13 MED ORDER — PANTOPRAZOLE SODIUM 40 MG PO TBEC: 40.0000 mg | DELAYED_RELEASE_TABLET | Freq: Every day | ORAL | 1 refills | Status: DC

## 2023-08-13 MED ORDER — DICYCLOMINE HCL 20 MG PO TABS: 20.0000 mg | ORAL_TABLET | Freq: Four times a day (QID) | ORAL | 1 refills | Status: DC | PRN

## 2023-08-13 NOTE — Patient Instructions (Addendum)
-  It was a pleasure to see you today! Please review your visit summary for helpful information -I would encourage you to follow your care via MyChart where you can access lab results, notes, messages, and more -If you feel that we did a nice job today, please complete your after-visit survey and leave us a Google review! Your CMA today was Kieandra and your provider was Dan Waddell, PA-C, DMSc -Please return for follow-up in about 2 months  

## 2023-08-13 NOTE — Progress Notes (Signed)
 Date:  08/13/2023   Name:  Javier BICKSLER Sr.   DOB:  Nov 30, 1960   MRN:  [id number]   Chief Complaint: Establish Care and Diarrhea  Diarrhea  This is a recurrent problem. Episode onset: X3 months. The problem occurs 2 to 4 times per day. The problem has been gradually worsening. The stool consistency is described as Watery. The patient states that diarrhea awakens him from sleep. Associated symptoms include bloating and increased flatus. There are no known risk factors. He has tried change of diet for the symptoms. The treatment provided mild relief.   Javier Martin is a very pleasant 63 y.o male previously seen by [facility] new to the practice today to establish care. He is joined by his partner Javier Martin.   Sees GI for ongoing issues with diarrhea. Has a gallbladder scan coming up on Wednesday. Previously benefited from Bentyl but has not taken it in about 3 years.   Also has intermittent GERD symptoms, seems to do better with Protonix than Nexium.   Reports some RLS which bothers Javier Martin but not so much the patient. Chart review shows prior iron deficiency but recent CBC is without anemia.   Testosterone pellets managed by another provider. Recent labs reviewed and at therapeutic level.  Known OSA, has CPAP but states it has not been nearly as effective recently, wonders if he may need a new one.     Medication list has been reviewed and updated.  Current Meds  Medication Sig   albuterol (PROVENTIL HFA;VENTOLIN HFA) 108 (90 BASE) MCG/ACT inhaler Inhale into the lungs every 6 (six) hours as needed for wheezing or shortness of breath.   buPROPion (WELLBUTRIN SR) 150 MG 12 hr tablet Take 150 mg by mouth daily.   Charcoal 200 MG CAPS Take 200 mg by mouth.   chlorthalidone (HYGROTON) 25 MG tablet Take 25 mg by mouth daily.   clindamycin  (CLEOCIN  T) 1 % SWAB Apply 1 application  topically See admin instructions. 1 - 2 times daily prn for back of neck   finasteride (PROPECIA)  1 MG tablet Take 1 mg by mouth daily.   furosemide (LASIX) 20 MG tablet Take 20 mg by mouth as needed for edema or fluid.   hyoscyamine (ANASPAZ) 0.125 MG TBDP disintergrating tablet Place 0.125 mg under the tongue every 6 (six) hours as needed.   lisinopril (ZESTRIL) 20 MG tablet Take 20 mg by mouth daily.   pantoprazole (PROTONIX) 40 MG tablet Take 1 tablet (40 mg total) by mouth daily.   tadalafil (CIALIS) 5 MG tablet Take 5 mg by mouth daily as needed for erectile dysfunction.   Testosterone (TESTOPEL) 75 MG PLLT    valACYclovir (VALTREX) 1000 MG tablet Take 2,000 mg by mouth.     Review of Systems  Gastrointestinal:  Positive for bloating, diarrhea and flatus.    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   OSA on CPAP 07/13/2020   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/2020   IBS (irritable bowel syndrome) 07/13/2020   Hypogonadism in male 07/13/2020   ED (erectile dysfunction) 07/13/2020   Depression with anxiety 07/13/2020   Chronic fatigue 07/13/2020   Decreased testosterone level 05/19/2020  Allergies  Allergen Reactions   Nutritional Supplements Hives   Red Dye    Milk (Cow) Other (See Comments)    Other Reaction: GI Upset  Other reaction(s): Other (See Comments)  Other Reaction: GI Upset  Other Reaction: GI Upset    Other reaction(s): Other (See Comments) Other Reaction: GI Upset    Immunization History  Administered Date(s) Administered   Hepatitis A, Adult 05/25/2014, 12/23/2014   Influenza-Unspecified 02/16/2019, 02/16/2020   PFIZER(Purple Top)SARS-COV-2 Vaccination 07/04/2019, 07/24/2019, 04/01/2020   PNEUMOCOCCAL CONJUGATE-20 08/23/2020   Tdap  06/04/2018, 06/06/2023   Zoster Recombinant(Shingrix) 03/20/2018, 05/22/2018    Past Surgical History:  Procedure Laterality Date   APPENDECTOMY  1993   COLONOSCOPY WITH PROPOFOL  N/A 04/01/2015   Procedure: COLONOSCOPY WITH PROPOFOL ;  Surgeon: Cassie Click, MD;  Location: [facility] ENDOSCOPY;  Service: Endoscopy;  Laterality: N/A;   EYE SURGERY  2010   Lasix   NASAL SEPTUM SURGERY  04/18/2007   pillar procedure  04/18/2007   TONSILLECTOMY      Social History   Tobacco Use   Smoking status: Never   Smokeless tobacco: Never  Vaping Use   Vaping status: Never Used  Substance Use Topics   Alcohol use: Not Currently   Drug use: No    Family History  Problem Relation Age of Onset   Hypertension Mother    Stroke Mother    Hearing loss Mother    Miscarriages / Stillbirths Mother    Hypertension Father    Colon cancer Father    Alcohol abuse Father    Cancer Father    Stroke Father    Arthritis Brother    Hypertension Brother    Vision loss Brother         08/13/2023    2:25 PM  GAD 7 : Generalized Anxiety Score  Nervous, Anxious, on Edge 3  Control/stop worrying 3  Worry too much - different things 3  Trouble relaxing 3  Restless 3  Easily annoyed or irritable 3  Afraid - awful might happen 0  Total GAD 7 Score 18  Anxiety Difficulty Extremely difficult       08/13/2023    2:24 PM  Depression screen PHQ 2/9  Decreased Interest 3  Down, Depressed, Hopeless 3  PHQ - 2 Score 6  Altered sleeping 3  Tired, decreased energy 3  Change in appetite 2  Feeling bad or failure about yourself  0  Trouble concentrating 3  Moving slowly or fidgety/restless 3  Suicidal thoughts 0  PHQ-9 Score 20  Difficult doing work/chores Extremely dIfficult    BP Readings from Last 3 Encounters:  08/13/23 136/70  05/07/23 129/79  04/16/23 132/81    Wt Readings from Last 3 Encounters:  08/13/23 226 lb (102.5 kg)  05/07/23 223 lb (101.2 kg)  04/16/23 221 lb 6.4 oz (100.4  kg)    BP 136/70   Pulse 73   Temp 98.6 F (37 C)   Ht 6' (1.829 m)   Wt 226 lb (102.5 kg)   SpO2 97%   BMI 30.65 kg/m   Physical Exam Vitals and nursing note reviewed.  Constitutional:      Appearance: Normal appearance.  Neck:     Vascular: No carotid bruit.  Cardiovascular:     Rate and Rhythm: Normal rate and regular rhythm.     Heart sounds: No murmur heard.    No friction rub. No gallop.  Pulmonary:     Effort: Pulmonary effort is normal.  Breath sounds: Normal breath sounds.  Abdominal:     General: There is no distension.     Palpations: Abdomen is soft.     Tenderness: There is no abdominal tenderness.  Musculoskeletal:        General: Normal range of motion.  Skin:    General: Skin is warm and dry.  Neurological:     Mental Status: He is alert and oriented to person, place, and time.     Gait: Gait is intact.  Psychiatric:        Mood and Affect: Mood and affect normal.     Recent Labs  No results found for: "NA", "K", "CL", "CO2", "GLUCOSE", "BUN", "CREATININE", "CALCIUM", "PROT", "ALBUMIN", "AST", "ALT", "ALKPHOS", "BILITOT", "GFRNONAA", "GFRAA"  No results found for: "WBC", "HGB", "HCT", "MCV", "PLT" No results found for: "HGBA1C" No results found for: "CHOL", "HDL", "LDLCALC", "LDLDIRECT", "TRIG", "CHOLHDL" No results found for: "TSH"   Assessment and Plan:  Irritable bowel syndrome with diarrhea Assessment & Plan: Discussed with patient that IBS is a diagnosis of exclusion.  He should continue his workup as intended with GI.  However in the meantime, I will refill his Bentyl for use up to 4 times per day.  High scores for both PHQ-9 and GAD-7 today, consider SSRI/SNRI in the near future.  Orders: -     Dicyclomine HCl; Take 1 tablet (20 mg total) by mouth every 6 (six) hours as needed (IBS).  Dispense: 90 tablet; Refill: 1  Gastroesophageal reflux disease without esophagitis Assessment & Plan: Refill pantoprazole.  We reviewed that it  is typically not advised to use PPI consistently for more than 3 months.  He verbalizes understanding.  Orders: -     Pantoprazole Sodium; Take 1 tablet (40 mg total) by mouth daily.  Dispense: 90 tablet; Refill: 1  Restless legs Assessment & Plan: Defer labs today, which were recently checked.  Instead, encouraged to try OTC iron every other day to see if this helps with his restless legs.  This may also help decrease his bowel movement frequency.   OSA on CPAP Assessment & Plan: Referring to [facility] for evaluation and management.   Peyronie's syndrome Assessment & Plan: Patient dissatisfied with surgical result.  Currently has a urologist, but advised that should he seek a second opinion or a more convenient location, we do have a urologist in this building.  He will think about it.      Return in about 2 months (around 10/13/2023) for OV f/u chronic conditions.  Sooner if needed.   Cody Das, PA-C, DMSc, Nutritionist [facility] Primary Care and Sports Medicine [facility] [phone]

## 2023-08-13 NOTE — Assessment & Plan Note (Signed)
 Discussed with patient that IBS is a diagnosis of exclusion.  He should continue his workup as intended with GI.  However in the meantime, I will refill his Bentyl for use up to 4 times per day.  High scores for both PHQ-9 and GAD-7 today, consider SSRI/SNRI in the near future.

## 2023-08-13 NOTE — Assessment & Plan Note (Signed)
 Refill pantoprazole.  We reviewed that it is typically not advised to use PPI consistently for more than 3 months.  He verbalizes understanding.

## 2023-08-13 NOTE — Assessment & Plan Note (Signed)
 Defer labs today, which were recently checked.  Instead, encouraged to try OTC iron every other day to see if this helps with his restless legs.  This may also help decrease his bowel movement frequency.

## 2023-08-13 NOTE — Assessment & Plan Note (Signed)
 Referring to [facility] for evaluation and management.

## 2023-08-13 NOTE — Assessment & Plan Note (Signed)
 Patient dissatisfied with surgical result.  Currently has a urologist, but advised that should he seek a second opinion or a more convenient location, we do have a urologist in this building.  He will think about it.

## 2023-08-23 ENCOUNTER — Encounter: Payer: Self-pay | Admitting: Dermatology

## 2023-08-23 ENCOUNTER — Ambulatory Visit: Admitting: Dermatology

## 2023-08-23 DIAGNOSIS — L603 Nail dystrophy: Secondary | ICD-10-CM | POA: Diagnosis not present

## 2023-08-23 DIAGNOSIS — L821 Other seborrheic keratosis: Secondary | ICD-10-CM

## 2023-08-23 DIAGNOSIS — Z85828 Personal history of other malignant neoplasm of skin: Secondary | ICD-10-CM

## 2023-08-23 DIAGNOSIS — L820 Inflamed seborrheic keratosis: Secondary | ICD-10-CM | POA: Diagnosis not present

## 2023-08-23 DIAGNOSIS — Z86018 Personal history of other benign neoplasm: Secondary | ICD-10-CM | POA: Diagnosis not present

## 2023-08-23 NOTE — Patient Instructions (Signed)

## 2023-08-23 NOTE — Progress Notes (Signed)
   Follow-Up Visit   Subjective  Javier ABBATIELLO Sr. is a 63 y.o. male who presents for the following: spots at right upper arm, left hand, ankles, some itch. Also a few spots at face. Hx of BCC, DN.   The patient has spots, moles and lesions to be evaluated, some may be new or changing and the patient may have concern these could be cancer.   The following portions of the chart were reviewed this encounter and updated as appropriate: medications, allergies, medical history  Review of Systems:  No other skin or systemic complaints except as noted in HPI or Assessment and Plan.  Objective  Well appearing patient in no apparent distress; mood and affect are within normal limits.   A focused examination was performed of the following areas: Arms, hands, legs, face  Relevant exam findings are noted in the Assessment and Plan.  R upper arm x 1, L 4th finger x 1 (2) Erythematous stuck-on, waxy papule or plaque  Assessment & Plan   SEBORRHEIC KERATOSIS - Stuck-on, waxy, tan-brown papules and/or plaques R arm L hand bilateral feet L infraorbital - Benign-appearing - Discussed benign etiology and prognosis. - Observe - Call for any changes  NAIL PROBLEM Exam: Radial lateral nail fold hyperkeratosis right 3rd finger  Treatment Plan: Monitor, RTC for changes   INFLAMED SEBORRHEIC KERATOSIS (2) R upper arm x 1, L 4th finger x 1 (2) Symptomatic, irritating, patient would like treated.  Benign-appearing.  Call clinic for new or changing lesions.   Discussed biopsy at right upper arm, patient deferred. Patient advised if spot does not clear up with LN2 he should RTC for biopsy.  Destruction of lesion - R upper arm x 1, L 4th finger x 1 (2) Complexity: simple   Destruction method: cryotherapy   Informed consent: discussed and consent obtained   Timeout:  patient name, date of birth, surgical site, and procedure verified Lesion destroyed using liquid nitrogen: Yes   Region frozen  until ice ball extended beyond lesion: Yes   Cryo cycles: 1 or 2. Outcome: patient tolerated procedure well with no complications   Post-procedure details: wound care instructions given    Return for TBSE, with Dr. Linnell Richardson, as scheduled, HxBCC, HxDN.  Kerstin Peeling, RMA, am acting as scribe for Harris Liming, MD .   Documentation: I have reviewed the above documentation for accuracy and completeness, and I agree with the above.  Harris Liming, MD

## 2023-08-30 NOTE — Telephone Encounter (Signed)
 Please review and advise.   JM

## 2023-09-04 ENCOUNTER — Encounter (INDEPENDENT_AMBULATORY_CARE_PROVIDER_SITE_OTHER): Payer: Self-pay

## 2023-09-16 ENCOUNTER — Other Ambulatory Visit: Payer: Self-pay | Admitting: Physician Assistant

## 2023-09-24 ENCOUNTER — Other Ambulatory Visit: Payer: Self-pay | Admitting: Physician Assistant

## 2023-09-24 DIAGNOSIS — K580 Irritable bowel syndrome with diarrhea: Secondary | ICD-10-CM

## 2023-09-25 NOTE — Telephone Encounter (Signed)
 Requested Prescriptions  Pending Prescriptions Disp Refills   dicyclomine  (BENTYL ) 20 MG tablet [Pharmacy Med Name: dicyclomine  20 mg tablet] 90 tablet 1    Sig: TAKE ONE TABLET BY MOUTH EVERY 6 HOURS as needed for IBS     Gastroenterology:  Antispasmodic Agents Failed - 09/25/2023 10:33 AM      Failed - Valid encounter within last 12 months    Recent Outpatient Visits           1 month ago Irritable bowel syndrome with diarrhea   [facility] at [facility], Arleen Lacer, [place]       Future Appointments             In 2 weeks Larkin Plumb, Arleen Lacer, PA [facility] Primary Care & Sports Medicine at [facility], [facility]   In 6 months Elta Halter, MD [facility]

## 2023-10-15 ENCOUNTER — Ambulatory Visit
Admission: RE | Admit: 2023-10-15 | Discharge: 2023-10-15 | Disposition: A | Discharge status: Home / Self Care | Source: Ambulatory Visit | Attending: Physician Assistant | Admitting: Physician Assistant

## 2023-10-15 ENCOUNTER — Encounter: Payer: Self-pay | Admitting: Physician Assistant

## 2023-10-15 ENCOUNTER — Other Ambulatory Visit: Payer: Self-pay | Admitting: Physician Assistant

## 2023-10-15 ENCOUNTER — Ambulatory Visit (INDEPENDENT_AMBULATORY_CARE_PROVIDER_SITE_OTHER): Admitting: Physician Assistant

## 2023-10-15 ENCOUNTER — Ambulatory Visit
Admission: RE | Admit: 2023-10-15 | Discharge: 2023-10-15 | Disposition: A | Discharge status: Home / Self Care | Attending: Physician Assistant | Admitting: Physician Assistant

## 2023-10-15 VITALS — BP 124/80 | HR 71 | Temp 98.2°F | Ht 72.0 in | Wt 225.0 lb

## 2023-10-15 DIAGNOSIS — M25511 Pain in right shoulder: Secondary | ICD-10-CM

## 2023-10-15 DIAGNOSIS — M25551 Pain in right hip: Secondary | ICD-10-CM

## 2023-10-15 DIAGNOSIS — K648 Other hemorrhoids: Secondary | ICD-10-CM

## 2023-10-15 DIAGNOSIS — K580 Irritable bowel syndrome with diarrhea: Secondary | ICD-10-CM

## 2023-10-15 DIAGNOSIS — F418 Other specified anxiety disorders: Secondary | ICD-10-CM

## 2023-10-15 MED ORDER — LOPERAMIDE HCL 2 MG PO TABS: 2.0000 mg | ORAL_TABLET | Freq: Two times a day (BID) | ORAL | 3 refills | Status: AC | PRN

## 2023-10-15 MED ORDER — HYDROCORTISONE ACETATE 25 MG RE SUPP: 25.0000 mg | Freq: Two times a day (BID) | RECTAL | 0 refills | Status: DC

## 2023-10-15 MED ORDER — PREDNISONE 20 MG PO TABS: 20.0000 mg | ORAL_TABLET | Freq: Two times a day (BID) | ORAL | 0 refills | Status: DC

## 2023-10-15 NOTE — Progress Notes (Signed)
 Date:  10/15/2023   Name:  Javier STIREWALT Sr.   DOB:  May 24, 1960   MRN:  [id number]   Chief Complaint: Medical Management of Chronic Issues  HPI Velinda returns for routine f/u after establishing care 2 months ago.    Sees GI for ongoing issues with diarrhea.  So far his workup has been unremarkable, but he says the Bentyl  I prescribed last visit has been a Secretary/administrator for him.  He takes 1 Bentyl  and 1 Imodium every morning and every night with good regulation of bowel habits.   Reports still having RLS. Chart review shows prior iron deficiency but recent CBC is without anemia.  Last visit I suggested he try OTC iron, which has not helped.   Testosterone pellets managed by urology. Recent labs reviewed and at therapeutic level.   Known OSA, has CPAP which was recently replaced by Avilys. He is grateful for this.   Complains of subacute right shoulder and right groin/hip pain for the last 3 weeks. It has been affecting his gait a little. No known injury or inciting event. Says this feels like a prior sarcoidosis flare he had years ago, minus edema which is not present this time. He is apprehensive about using NSAIDs due to his borderline GFR which fluctuates around 60-70. Says prednisone took care of it before.   Says he has been diagnosed with internal hemorrhoids, thinks they are flaring up. No bleeding, itching, or rectal pain, just states he can feel them when he wipes. Requesting suppository which he has used before.   Chronically struggles with anxiety which he feels is workplace-related. Still has 21 months before retirement. Has tried medication and therapy, but did not find either very effective for him. Says his biggest stress-reliever is going out on his boat.    Medication list has been reviewed and updated.  Current Meds  Medication Sig   albuterol (PROVENTIL HFA;VENTOLIN HFA) 108 (90 BASE) MCG/ACT inhaler Inhale into the lungs every 6 (six) hours as needed for wheezing  or shortness of breath.   buPROPion (WELLBUTRIN SR) 150 MG 12 hr tablet Take 150 mg by mouth daily.   buPROPion (WELLBUTRIN XL) 300 MG 24 hr tablet take 1 tablet by mouth every morning   Charcoal 200 MG CAPS Take 200 mg by mouth.   chlorthalidone (HYGROTON) 25 MG tablet Take 25 mg by mouth daily.   clindamycin  (CLEOCIN  T) 1 % SWAB Apply 1 application  topically See admin instructions. 1 - 2 times daily prn for back of neck   dicyclomine  (BENTYL ) 20 MG tablet TAKE ONE TABLET BY MOUTH EVERY 6 HOURS as needed for IBS   finasteride (PROPECIA) 1 MG tablet Take 1 mg by mouth daily.   furosemide (LASIX) 20 MG tablet Take 20 mg by mouth as needed for edema or fluid.   hydrocortisone (ANUSOL-HC) 25 MG suppository Place 1 suppository (25 mg total) rectally 2 (two) times daily.   hyoscyamine (ANASPAZ) 0.125 MG TBDP disintergrating tablet Place 0.125 mg under the tongue every 6 (six) hours as needed.   lisinopril (ZESTRIL) 20 MG tablet TAKE ONE TABLET BY MOUTH ONCE DAILY   loperamide (IMODIUM A-D) 2 MG tablet Take 1 tablet (2 mg total) by mouth 2 (two) times daily as needed for diarrhea or loose stools.   pantoprazole  (PROTONIX ) 40 MG tablet Take 1 tablet (40 mg total) by mouth daily.   predniSONE (DELTASONE) 20 MG tablet Take 1 tablet (20 mg total) by mouth 2 (two)  times daily with a meal.   tadalafil (CIALIS) 5 MG tablet Take 5 mg by mouth daily as needed for erectile dysfunction.   Testosterone (TESTOPEL) 75 MG PLLT    valACYclovir (VALTREX) 1000 MG tablet Take 2,000 mg by mouth.     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   IBS (irritable bowel syndrome) 07/13/2020   Hypogonadism in male 07/13/2020   ED (erectile dysfunction) 07/13/2020   Depression with anxiety 07/13/2020   Chronic fatigue 07/13/2020   Decreased testosterone level 05/19/2020    Allergies  Allergen Reactions   Nutritional Supplements Hives   Red Dye    Milk (Cow) Other (See Comments)    Other Reaction: GI Upset  Other reaction(s): Other (See Comments)  Other Reaction: GI Upset  Other Reaction: GI Upset    Other reaction(s): Other (See Comments) Other Reaction: GI Upset    Immunization History  Administered Date(s) Administered   Hepatitis A, Adult 05/25/2014, 12/23/2014   Influenza-Unspecified 02/16/2019, 02/16/2020   PFIZER(Purple Top)SARS-COV-2 Vaccination 07/04/2019, 07/24/2019, 04/01/2020   PNEUMOCOCCAL CONJUGATE-20 08/23/2020   Tdap 06/04/2018, 06/06/2023   Zoster Recombinant(Shingrix) 03/20/2018, 05/22/2018    Past Surgical History:  Procedure Laterality Date   APPENDECTOMY  1993   COLONOSCOPY WITH PROPOFOL  N/A 04/01/2015   Procedure: COLONOSCOPY WITH PROPOFOL ;  Surgeon: Lamar ONEIDA Holmes, MD;  Location: [facility] ENDOSCOPY;  Service: Endoscopy;  Laterality: N/A;   EYE SURGERY  2010   Lasix   NASAL SEPTUM SURGERY  04/18/2007   pillar procedure  04/18/2007   TONSILLECTOMY      Social History   Tobacco Use   Smoking status: Never   Smokeless tobacco: Never  Vaping Use   Vaping status: Never Used  Substance Use Topics   Alcohol use: Not Currently   Drug use: No    Family History  Problem Relation Age of Onset   Hypertension Mother    Stroke Mother    Hearing loss Mother    Miscarriages / Stillbirths Mother    Hypertension Father    Colon cancer Father    Alcohol abuse Father    Cancer Father    Stroke Father    Arthritis Brother    Hypertension Brother    Vision loss Brother         10/15/2023    8:41 AM 08/13/2023    2:25 PM  GAD 7 : Generalized Anxiety Score  Nervous,  Anxious, on Edge 3 3  Control/stop worrying 3 3  Worry too much - different things 3 3  Trouble relaxing 3 3  Restless 3 3  Easily annoyed or irritable 3 3  Afraid - awful might happen 1 0  Total GAD 7 Score 19 18  Anxiety Difficulty Extremely difficult Extremely difficult       10/15/2023    8:41 AM 08/13/2023    2:24 PM  Depression screen PHQ 2/9  Decreased Interest 2 3  Down, Depressed, Hopeless 2 3  PHQ - 2 Score 4 6  Altered sleeping 0 3  Tired, decreased energy 3 3  Change in appetite 2 2  Feeling bad or failure about yourself  0 0  Trouble concentrating 3 3  Moving slowly or fidgety/restless 3 3  Suicidal thoughts 0 0  PHQ-9 Score 15 20  Difficult doing work/chores Somewhat difficult Extremely dIfficult    BP Readings from Last 3 Encounters:  10/15/23 124/80  08/13/23 136/70  05/07/23 129/79    Wt Readings from Last 3 Encounters:  10/15/23 225 lb (102.1 kg)  08/13/23 226 lb (102.5 kg)  05/07/23 223 lb (101.2 kg)    BP 124/80   Pulse 71   Temp 98.2 F (36.8 C)   Ht 6' (1.829 m)   Wt 225 lb (102.1 kg)   SpO2 98%   BMI 30.52 kg/m   Physical Exam Vitals and nursing note reviewed.  Constitutional:      Appearance: Normal appearance.   Cardiovascular:     Rate and Rhythm: Normal rate.  Pulmonary:     Effort: Pulmonary effort is normal.  Abdominal:     General: There is no distension.   Musculoskeletal:        General: Normal range of motion.     Comments: Painful resisted right hip flexion, also external rotation. Gait appears grossly normal today. No mass appreciated in the groin, though it is somewhat tender here.  Right shoulder seems essentially normal, though he has some pain at the extremes of abduction and adduction. Negative empty can.    Skin:    General: Skin is warm and dry.   Neurological:     Mental Status: He is alert and oriented to person, place, and time.     Gait: Gait is intact.   Psychiatric:        Mood and Affect: Mood  and affect normal.     Recent Labs  No results found for: NA, K, CL, CO2, GLUCOSE, BUN, CREATININE, CALCIUM, PROT, ALBUMIN, AST, ALT, ALKPHOS, BILITOT, GFRNONAA, GFRAA  No results found for: WBC, HGB, HCT, MCV, PLT No results found for: HGBA1C No results found for: CHOL, HDL, LDLCALC, LDLDIRECT, TRIG, CHOLHDL No results found for: TSH   Assessment and Plan:  1. Irritable bowel syndrome with diarrhea (Primary) Refill loperamide. Current regimen seems to be working well.  - loperamide (IMODIUM A-D) 2 MG tablet; Take 1 tablet (2 mg total) by mouth 2 (two) times daily as needed for diarrhea or loose stools.  Dispense: 180 tablet; Refill: 3  2. Subacute pain of right shoulder Shoulder xray appears normal on initial impression, radiology report pending. Try short course prednisone since he is cautious about NSAID despite reassurance.   - DG Shoulder Right; Future - predniSONE (DELTASONE) 20 MG tablet; Take 1 tablet (20 mg total) by mouth 2 (two) times daily with a meal.  Dispense: 10 tablet; Refill: 0  3. Right hip pain Right hip ray initially suggests mild-moderate osteoarthritis, radiology report pending. Plan for short course prednisone. Will let me know if not improving as we can do internal referral to my sports med colleague Dr. Alvia at any time.   - DG Hip Unilat W OR W/O Pelvis 2-3 Views Right; Future - predniSONE (DELTASONE) 20 MG tablet; Take 1 tablet (20 mg total) by mouth 2 (two) times daily with a meal.  Dispense: 10 tablet; Refill: 0  4. Internal hemorrhoids Rectal exam not performed today. Will try Anusol suppositories. CRC screen up to date.  - hydrocortisone (ANUSOL-HC) 25 MG suppository; Place 1 suppository (25 mg total) rectally 2 (two) times daily.  Dispense: 12 suppository; Refill: 0  5. Depression with anxiety Does not desire pharmacotherapy at this time. Encouraged stress reduction.     Return in  about 3 months (around 01/15/2024) for OV f/u chronic conditions.    Rolan Hoyle, PA-C, DMSc, Nutritionist [facility] [phone]

## 2023-10-16 NOTE — Telephone Encounter (Signed)
 Requested medications are due for refill today.  unsure  Requested medications are on the active medications list.  yes  Last refill. 03/05/2018  Future visit scheduled.   yes  Notes to clinic.  Medication is historical.    Requested Prescriptions  Pending Prescriptions Disp Refills   chlorthalidone (HYGROTON) 25 MG tablet [Pharmacy Med Name: chlorthalidone 25 mg tablet] 30 tablet 0    Sig: take 1 tablet by mouth every morning     Cardiovascular: Diuretics - Thiazide Failed - 10/16/2023  4:27 PM      Failed - Cr in normal range and within 180 days    No results found for: CREATININE, LABCREAU, LABCREA, POCCRE       Failed - K in normal range and within 180 days    No results found for: K, POTASSIUM, POCK       Failed - Na in normal range and within 180 days    No results found for: NA, POCNA       Passed - Last BP in normal range    BP Readings from Last 1 Encounters:  10/15/23 124/80         Passed - Valid encounter within last 6 months    Recent Outpatient Visits           Yesterday Irritable bowel syndrome with diarrhea   [facility] at [facility], Javier SQUIBB, PA   2 months ago Irritable bowel syndrome with diarrhea   [facility] at [facility], Javier Martin, Javier Martin       Future Appointments             In 5 months Hester Alm BROCKS, MD [facility]

## 2023-10-17 NOTE — Telephone Encounter (Signed)
 Please review.  KP

## 2023-10-21 ENCOUNTER — Ambulatory Visit: Payer: Self-pay | Admitting: Physician Assistant

## 2023-10-29 ENCOUNTER — Other Ambulatory Visit: Payer: Self-pay | Admitting: Physician Assistant

## 2023-10-29 DIAGNOSIS — M159 Polyosteoarthritis, unspecified: Secondary | ICD-10-CM | POA: Insufficient documentation

## 2023-10-29 NOTE — Telephone Encounter (Signed)
 Please review.  KP

## 2023-11-03 NOTE — Progress Notes (Unsigned)
 MRN : [id number]  Javier MCELHINEY Sr. is a 63 y.o. (1960/11/02) male who presents with chief complaint of legs swell.  History of Present Illness:  The patient returns to the office for followup evaluation regarding leg swelling.  The swelling has been stable continued .  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  He primarily sees the poorly controlled edema when he spends the month of September fishing [place].  He essentially is standing in his boat for nearly 12 hours.  There have not been any interval development of a ulcerations or wounds.  On a day-to-day basis his edema seems well-controlled.   Since the previous visit the patient has been wearing graduated compression stockings as he needs and has noted some improvement in the lymphedema.    The patient also states elevation during the day and exercise (such as walking) is being done too.   Duplex ultrasound bilateral lower extremity venous system shows trivial reflux on the left in the deep venous system no evidence of deep venous reflux on the right.  On the superficial system the right great saphenous vein does show some reflux.  The left great saphenous vein is competent.  Bilateral small saphenous veins are competent.  (Of note the patient's edema is pretty much the same bilaterally.  He states the right leg does not have a significantly worse situation compared to the left)   No outpatient medications have been marked as taking for the 11/05/23 encounter (Appointment) with Jama, Cordella MATSU, MD.    Past Medical History:  Diagnosis Date   Allergy 1960   Anxiety 1960   Asthma 1970   Basal cell carcinoma 03/07/2022   Right inf cheek - [facility]   Dysplastic nevus 02/13/2019   Left low back, 11cm lat. to spine. Mild atypia, peripheral and deep margins involved.    GERD (gastroesophageal reflux disease)  1990   Hypertension 1980   Inactive TB 1999   Sarcoidosis 1997-2000   Sleep apnea 1990   Tuberculosis 1990   Inactive.    Past Surgical History:  Procedure Laterality Date   APPENDECTOMY  1993   COLONOSCOPY WITH PROPOFOL  N/A 04/01/2015   Procedure: COLONOSCOPY WITH PROPOFOL ;  Surgeon: Lamar ONEIDA Holmes, MD;  Location: [facility] ENDOSCOPY;  Service: Endoscopy;  Laterality: N/A;   EYE SURGERY  2010   Lasix   NASAL SEPTUM SURGERY  04/18/2007   pillar procedure  04/18/2007   TONSILLECTOMY      Social History Social History   Tobacco Use   Smoking status: Never   Smokeless tobacco: Never  Vaping Use   Vaping status: Never Used  Substance Use Topics   Alcohol use: Not Currently   Drug use: No    Family History Family History  Problem Relation Age of Onset   Hypertension Mother    Stroke Mother    Hearing loss Mother    Miscarriages / [place] Mother    Hypertension Father    Colon cancer Father    Alcohol abuse Father    Cancer Father    Stroke Father    Arthritis Brother    Hypertension Brother    Vision loss Brother     Allergies  Allergen Reactions  Nutritional Supplements Hives   Red Dye    Milk (Cow) Other (See Comments)    Other Reaction: GI Upset  Other reaction(s): Other (See Comments)  Other Reaction: GI Upset  Other Reaction: GI Upset    Other reaction(s): Other (See Comments) Other Reaction: GI Upset     REVIEW OF SYSTEMS (Negative unless checked)  Constitutional: [] Weight loss  [] Fever  [] Chills Cardiac: [] Chest pain   [] Chest pressure   [] Palpitations   [] Shortness of breath when laying flat   [] Shortness of breath with exertion. Vascular:  [] Pain in legs with walking   [x] Pain in legs with standing  [] History of DVT   [] Phlebitis   [x] Swelling in legs   [] Varicose veins   [] Non-healing ulcers Pulmonary:   [] Uses home oxygen   [] Productive cough   [] Hemoptysis   [] Wheeze  [] COPD   [x] Asthma Neurologic:  [] Dizziness   [] Seizures   [] History  of stroke   [] History of TIA  [] Aphasia   [] Vissual changes   [] Weakness or numbness in arm   [] Weakness or numbness in leg Musculoskeletal:   [] Joint swelling   [] Joint pain   [] Low back pain Hematologic:  [] Easy bruising  [] Easy bleeding   [] Hypercoagulable state   [] Anemic Gastrointestinal:  [] Diarrhea   [] Vomiting  [x] Gastroesophageal reflux/heartburn   [] Difficulty swallowing. Genitourinary:  [] Chronic kidney disease   [] Difficult urination  [] Frequent urination   [] Blood in urine Skin:  [] Rashes   [] Ulcers  Psychological:  [] History of anxiety   []  History of major depression.  Physical Examination  There were no vitals filed for this visit. There is no height or weight on file to calculate BMI. Gen: WD/WN, NAD Head: [place]/AT, No temporalis wasting.  Ear/Nose/Throat: Hearing grossly intact, nares w/o erythema or drainage, pinna without lesions Eyes: PER, EOMI, sclera nonicteric.  Neck: Supple, no gross masses.  No JVD.  Pulmonary:  Good air movement, no audible wheezing, no use of accessory muscles.  Cardiac: RRR, precordium not hyperdynamic. Vascular:  scattered varicosities present bilaterally.  No significant venous stasis changes to the legs bilaterally.  Trace soft pitting edema, CEAP [id number]  Vessel Right Left  Radial Palpable Palpable  Gastrointestinal: soft, non-distended. No guarding/no peritoneal signs.  Musculoskeletal: M/S 5/5 throughout.  No deformity.  Neurologic: CN 2-12 intact. Pain and light touch intact in extremities.  Symmetrical.  Speech is fluent. Motor exam as listed above. Psychiatric: Judgment intact, Mood & affect appropriate for pt's clinical situation. Dermatologic: Venous rashes no ulcers noted.  No changes consistent with cellulitis. Lymph : No lichenification or skin changes of chronic lymphedema.  CBC No results found for: WBC, HGB, HCT, MCV, PLT  BMET No results found for: NA, K, CL, CO2, GLUCOSE, BUN, CREATININE,  CALCIUM, GFRNONAA, GFRAA CrCl cannot be calculated (No successful lab value found.).  COAG No results found for: INR, PROTIME  Radiology DG Hip Unilat W OR W/O Pelvis 2-3 Views Right Result Date: 10/19/2023 CLINICAL DATA:  atraumatic right hip/groin pain x3 weeks, painful gait EXAM: DG HIP (WITH OR WITHOUT PELVIS) 2-3V RIGHT COMPARISON:  None Available. FINDINGS: There is no evidence of hip fracture or dislocation of the right hip. No acute displaced fracture left hip on frontal view. No acute displaced fracture or diastasis of the bones of the pelvis. There is no evidence of arthropathy or other focal bone abnormality. Right lower lobe vascular clips and likely surgical staples. IMPRESSION: Negative for acute traumatic injury. Electronically Signed   By: Morgane  Naveau M.D.   On:  10/19/2023 23:59   DG Shoulder Right Result Date: 10/19/2023 CLINICAL DATA:  atraumatic right shoulder pain x3wk EXAM: RIGHT SHOULDER - 2+ VIEW COMPARISON:  None Available. FINDINGS: No acute fracture or dislocation. Moderate AC joint space loss. Soft tissues are unremarkable. IMPRESSION: 1. No acute fracture or dislocation. 2. Moderate osteoarthritis of the AC joint. Electronically Signed   By: Rogelia Myers M.D.   On: 10/19/2023 14:30     Assessment/Plan 1. Lymphedema (Primary) Recommend:  No surgery or intervention at this point in time.  I have reviewed my discussion with the patient regarding venous insufficiency and why it causes symptoms. I have discussed with the patient the chronic skin changes that accompany venous insufficiency and the long term sequela such as ulceration. Patient will contnue wearing graduated compression stockings on a daily basis, as this has provided excellent control of his edema. The patient will put the stockings on first thing in the morning and removing them in the evening. The patient is reminded not to sleep in the stockings.  In addition, behavioral modification  including elevation during the day will be initiated. Exercise is strongly encouraged.  Previous duplex ultrasound of the lower extremities shows normal deep system, no significant superficial reflux was identified.  Given the patient's good control and lack of any problems regarding the venous insufficiency and lymphedema a lymph pump in not need at this time.    The patient will follow up with me PRN should anything change.  The patient voices agreement with this plan.  2. Benign essential HTN Continue antihypertensive medications as already ordered, these medications have been reviewed and there are no changes at this time.  3. Mild asthma without complication, unspecified whether persistent Continue pulmonary medications and aerosols as already ordered, these medications have been reviewed and there are no changes at this time.   4. Gastroesophageal reflux disease without esophagitis Continue PPI as already ordered, this medication has been reviewed and there are no changes at this time.  Avoidence of caffeine and alcohol  Moderate elevation of the head of the bed     Cordella Shawl, MD  11/03/2023 2:59 PM

## 2023-11-05 ENCOUNTER — Encounter (INDEPENDENT_AMBULATORY_CARE_PROVIDER_SITE_OTHER): Payer: Self-pay | Admitting: Vascular Surgery

## 2023-11-05 ENCOUNTER — Ambulatory Visit (INDEPENDENT_AMBULATORY_CARE_PROVIDER_SITE_OTHER): Payer: 59 | Admitting: Vascular Surgery

## 2023-11-05 ENCOUNTER — Other Ambulatory Visit: Payer: Self-pay | Admitting: Physician Assistant

## 2023-11-05 VITALS — BP 138/79 | HR 76 | Resp 18 | Ht 71.0 in | Wt 225.2 lb

## 2023-11-05 DIAGNOSIS — I890 Lymphedema, not elsewhere classified: Secondary | ICD-10-CM

## 2023-11-05 DIAGNOSIS — I10 Essential (primary) hypertension: Secondary | ICD-10-CM

## 2023-11-05 DIAGNOSIS — J45909 Unspecified asthma, uncomplicated: Secondary | ICD-10-CM | POA: Diagnosis not present

## 2023-11-05 DIAGNOSIS — K219 Gastro-esophageal reflux disease without esophagitis: Secondary | ICD-10-CM | POA: Diagnosis not present

## 2023-11-05 MED ORDER — FINASTERIDE 1 MG PO TABS: 1.0000 mg | ORAL_TABLET | Freq: Every day | ORAL | 2 refills | Status: AC

## 2023-11-05 NOTE — Telephone Encounter (Signed)
 Please review. Will you take over this medication?  KP

## 2023-11-19 ENCOUNTER — Other Ambulatory Visit: Payer: Self-pay | Admitting: Physician Assistant

## 2023-11-20 ENCOUNTER — Ambulatory Visit: Attending: Physician Assistant | Admitting: Physical Therapy

## 2023-11-20 DIAGNOSIS — M25551 Pain in right hip: Secondary | ICD-10-CM | POA: Insufficient documentation

## 2023-11-20 DIAGNOSIS — M25552 Pain in left hip: Secondary | ICD-10-CM | POA: Diagnosis present

## 2023-11-20 DIAGNOSIS — M159 Polyosteoarthritis, unspecified: Secondary | ICD-10-CM | POA: Diagnosis not present

## 2023-11-20 DIAGNOSIS — M25512 Pain in left shoulder: Secondary | ICD-10-CM | POA: Insufficient documentation

## 2023-11-20 DIAGNOSIS — R262 Difficulty in walking, not elsewhere classified: Secondary | ICD-10-CM | POA: Insufficient documentation

## 2023-11-20 DIAGNOSIS — M25511 Pain in right shoulder: Secondary | ICD-10-CM | POA: Diagnosis present

## 2023-11-20 DIAGNOSIS — M6281 Muscle weakness (generalized): Secondary | ICD-10-CM | POA: Insufficient documentation

## 2023-11-20 NOTE — Therapy (Unsigned)
 OUTPATIENT PHYSICAL THERAPY SHOULDER EVALUATION  Patient Name: Javier Martin Sr. MRN: [id number] DOB:03-29-1961, 63 y.o., male Today's Date: 11/20/2023  END OF SESSION:  PT End of Session - 11/20/23 0902     Visit Number 1    Number of Visits 17    Date for PT Re-Evaluation 01/15/24    Authorization Type [employer] 2025: VL 60 combined PT/OT/Speech    PT Start Time 0907    PT Stop Time 0953    PT Time Calculation (min) 46 min    Activity Tolerance Patient limited by pain    Behavior During Therapy [facility] for tasks assessed/performed          Past Medical History:  Diagnosis Date   Allergy 1960   Anxiety 1960   Asthma 1970   Basal cell carcinoma 03/07/2022   Right inf cheek - [facility]   Dysplastic nevus 02/13/2019   Left low back, 11cm lat. to spine. Mild atypia, peripheral and deep margins involved.    GERD (gastroesophageal reflux disease) 1990   Hypertension 1980   Inactive TB 1999   Sarcoidosis 1997-2000   Sleep apnea 1990   Tuberculosis 1990   Inactive.   Past Surgical History:  Procedure Laterality Date   APPENDECTOMY  1993   COLONOSCOPY WITH PROPOFOL  N/A 04/01/2015   Procedure: COLONOSCOPY WITH PROPOFOL ;  Surgeon: Javier ONEIDA Holmes, MD;  Location: [facility] ENDOSCOPY;  Service: Endoscopy;  Laterality: N/A;   EYE SURGERY  2010   Lasix   NASAL SEPTUM SURGERY  04/18/2007   pillar procedure  04/18/2007   TONSILLECTOMY     Patient Active Problem List   Diagnosis Date Noted   Generalized osteoarthritis of multiple sites 10/29/2023   History of adenomatous polyp of colon 08/13/2023   Restless legs 08/13/2023   Lymphedema 05/05/2023   Mild asthma 10/11/2020   History of iron deficiency 10/11/2020   GERD (gastroesophageal reflux disease) 07/13/2020   Benign essential HTN 07/13/2020   Sarcoidosis 07/13/2020   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   OSA on CPAP 07/13/2020   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/2020   IBS (irritable bowel syndrome) 07/13/2020   Hypogonadism in male 07/13/2020    ED (erectile dysfunction) 07/13/2020   Depression with anxiety 07/13/2020   Chronic fatigue 07/13/2020   Decreased testosterone level 05/19/2020    PCP: Toribio SHAUNNA Hoyle, PA  REFERRING PROVIDER: Toribio SHAUNNA Hoyle, PA  REFERRING DIAG: M15.9 (ICD-10-CM) - Generalized osteoarthritis of multiple sites   RATIONALE FOR EVALUATION AND TREATMENT: Rehabilitation  THERAPY DIAG: No diagnosis found.  ONSET DATE: 3 months ago  FOLLOW-UP APPT SCHEDULED WITH REFERRING PROVIDER: None for this condition scheduled soon    SUBJECTIVE:  SUBJECTIVE STATEMENT:  Pt is a 63 year old male referred from PCP for osteoarthritis of multiple joints; he has Hx of groin pain beginning on R side than hurting L. Then, he had R shoulder pain with eventual progression to L shoulder pain. Pt reports primarily L>R shoulder pain this AM. No numbness/tingling.   PERTINENT HISTORY: Pain began with groin pain with each step. He had R shoulder pain starting after that. He had prednisone  taper that made it better. Patient reports that pain progressed into involving bilateral shoulders and bilateral groin region. Pt reports this past Friday, he started having pain along base of his neck and was having severe pain this past weekend. He reports more popping in his R shoulder over this past week. Pt reports taking magnesium seems to have helped; condition is better with taking this and resting. Pt reports sarcoidosis diagnosis 25 years ago that led to multi-site joint pain. Pt reports 20 years since significant flare-up of this condition. 50 out of 10 pain when it's flared up.   Pt reports notable pain with attempting to lift his R arm.   PAIN:  Pain Intensity: Present: 3/10, Best: 0-1/10 (on prednisone ), Worst: 10/10 Pain location: L deltoid > R  deltoid/ACJ region; R and L groin/medial thigh pain Pain Quality: aching , intermittent sharp pain with specific motion  Radiating: No  Numbness/Tingling: No N//T Focal Weakness: No Aggravating factors: transferring from bed/chair/couch, rolling in bed, walking, sitting Relieving factors: prednisone , magnesium supplement 24-hour pain behavior: No specific time of day  History of prior shoulder or neck/shoulder injury, pain, surgery, or therapy: No Falls: Has patient fallen in last 6 months? No, Number of falls: N/A Dominant hand: right Imaging: Yes ;  CLINICAL DATA:  atraumatic right shoulder pain x3wk  EXAM: RIGHT SHOULDER - 2+ VIEW  COMPARISON:  None Available.  FINDINGS: No acute fracture or dislocation. Moderate AC joint space loss. Soft tissues are unremarkable. ...     Narrative & Impression  CLINICAL DATA:  atraumatic right hip/groin pain x3 weeks, painful gait   EXAM: DG HIP (WITH OR WITHOUT PELVIS) 2-3V RIGHT   COMPARISON:  None Available.   FINDINGS: There is no evidence of hip fracture or dislocation of the right hip. No acute displaced fracture left hip on frontal view. No acute displaced fracture or diastasis of the bones of the pelvis. There is no evidence of arthropathy or other focal bone abnormality.   Right lower lobe vascular clips and likely surgical staples.   IMPRESSION: Negative for acute traumatic injury.      Red flags (personal history of cancer, chills/fever, night sweats, nausea, vomiting, unrelenting pain, unexplained weight gain/loss): Positive for skin cancer; removed several months ago    PRECAUTIONS: None  WEIGHT BEARING RESTRICTIONS: No  FALLS: Has patient fallen in last 6 months? 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  Recent memory is intact.  Remote memory is intact.  Attention span and concentration are intact.  Expressive speech is intact.  Patient's fund of knowledge is within normal limits for educational level.    Gross Musculoskeletal Assessment Tremor: None Bulk: Normal Tone: Normal  Posture Rounded shoulders, inc thoracic kyphosis, forward head  Cervical Screen AROM: see below Spurlings A (ipsilateral lateral flexion/axial compression): R: Negative (pain L UT with R lateral flexion) L: Negative Distraction Test: Negative Hoffman Sign (cervical cord compression): R: Negative L: Negative ULTTs deferred  AROM AROM (Normal range in degrees) AROM  Cervical  Flexion (50) WNL  Extension (80) 30* (pain low back with extension)  Right lateral flexion (45) WNL  Left lateral flexion (45) WNL* (ipsilateral pain affecting medial L UT)   Right rotation (85) WNL  Left rotation (85) WNL* (pain L UT)   Right Left  Shoulder    Flexion 145 158  Extension    Abduction 170 172  External Rotation (arm at side) 56 60  Internal Rotation [facility] WFL  Hands Behind Head    Hands Behind Back        Elbow    Flexion WNL WNL  Extension WNL WNL  Pronation    Supination    (* = pain; Blank rows = not tested)  UE MMT: MMT (out of 5) Right 11/20/23 Left 11/20/23      Shoulder   Flexion 4-* 4-*  Extension    Abduction 4+ 5-  External rotation 4-* 4-  Internal rotation 5 5  Horizontal abduction    Horizontal adduction    Lower Trapezius    Rhomboids        Elbow  Flexion 5 5  Extension 5 5   Pronation    Supination        (* = pain; Blank rows = not tested)  Sensation Negative for sensory deficit UE/LE   Reflexes Deferred  Palpation Location LEFT  RIGHT           Subocciptials    Cervical paraspinals    Upper Trapezius 0 0  Levator Scapulae    Rhomboid Major/Minor    Sternoclavicular joint    Acromioclavicular joint 0 0  Coracoid process 0 0  Long head of biceps 0 0  Supraspinatus 0 0  Infraspinatus 0 0  Subscapularis    Teres Minor 0 0  Teres Major    Pectoralis Major 0 0  Pectoralis Minor    Anterior Deltoid 0 0  Lateral Deltoid 0 0  Posterior Deltoid 0 0  Latissimus Dorsi    Sternocleidomastoid    (Blank rows = not tested) Graded on 0-4 scale (0 = no pain, 1 = pain, 2 = pain with wincing/grimacing/flinching, 3 = pain with withdrawal, 4 = unwilling to allow palpation), (Blank rows = not tested)    SPECIAL TESTS Rotator Cuff  Drop Arm Test: Negative Painful Arc (Pain from 60 to 120 degrees scaption): Negative Infraspinatus Muscle Test: Positive If all 3 tests positive, the probability of a full-thickness rotator cuff tear is 91%  Subacromial Impingement Hawkins-Kennedy: Not examined Neer (Block scapula, PROM flexion): Negative Painful Arc (Pain from 60 to 120 degrees scaption): Negative Empty Can: Negative External Rotation Resistance: Positive Horizontal Adduction: Not examined Scapular Assist: Not examined   Bicep Tendon Pathology Speed (shoulder flexion to 90, external rotation, full elbow extension, and forearm supination with resistance: Positive Yergason's (resisted shoulder ER and supination/biceps tendon pathology): R Negative, L Positive (mild pain)     TODAY'S TREATMENT   11/20/23   Therapeutic  Exercise - for HEP establishment, discussion on appropriate exercise/activity modification, PT education   Reviewed baseline home exercises and provided handout for MedBridge program (see Access Code); tactile cueing and therapist  demonstration utilized as needed for carryover of proper technique to HEP.    Patient education on current condition, anatomy involved, prognosis, plan of care. Discussion on activity modification to prevent flare-up of condition, including .     PATIENT EDUCATION:  Education details: see above for patient education details Person educated: Patient Education method: Explanation, Demonstration, and Handouts Education comprehension: verbalized understanding and returned demonstration   HOME EXERCISE PROGRAM:  Access Code: [email or web address] URL: [email or web address] Date: 11/20/2023 Prepared by: Venetia Endo  Exercises - Supine Shoulder Flexion with Dowel  - 2 x daily - 7 x weekly - 2 sets - 10 reps - Supine Shoulder External Rotation with Dowel  - 2 x daily - 7 x weekly - 2 sets - 10 reps - Seated Scapular Retraction  - 2 x daily - 7 x weekly - 2 sets - 10 reps - 3sec hold  ASSESSMENT:  CLINICAL IMPRESSION: Patient is a 63 y.o. male who was seen today for physical therapy evaluation and treatment for ***.   OBJECTIVE IMPAIRMENTS: {opptimpairments:25111}.   ACTIVITY LIMITATIONS: {activitylimitations:27494}  PARTICIPATION LIMITATIONS: {participationrestrictions:25113}  PERSONAL FACTORS: {Personal factors:25162} are also affecting patient's functional outcome.   REHAB POTENTIAL: {rehabpotential:25112}  CLINICAL DECISION MAKING: {clinical decision making:25114}  EVALUATION COMPLEXITY: {Evaluation complexity:25115}   GOALS: Goals reviewed with patient? Yes  SHORT TERM GOALS: Target date: {follow up:25551}  Pt will be independent with HEP to improve strength and decrease shoulder pain to improve pain-free function at home and work. Baseline: *** Goal status: INITIAL   LONG TERM GOALS: Target date: {follow up:25551}  Pt will increase FOTO to at least *** to demonstrate significant improvement in function at home and work related to shoulder pain  Baseline:   Goal status: INITIAL  2.  Pt will decrease worst shoulder pain by at least 3 points on the NPRS in order to demonstrate clinically significant reduction in shoulder pain. Baseline: *** Goal status: INITIAL  3.  Pt will decrease quick DASH score by at least 8% in order to demonstrate clinically significant reduction in disability related to shoulder pain        Baseline: *** Goal status: INITIAL  4. Pt will increase strength by at least 1/2 MMT grade in order to demonstrate improvement in strength and function         Baseline: *** Goal status: INITIAL   PLAN: PT FREQUENCY: 1-2x/week  PT DURATION: {rehab duration:25117}  PLANNED INTERVENTIONS: Therapeutic exercises, Therapeutic activity, Neuromuscular re-education, Balance training, Gait training, Patient/Family education, Self Care, Joint mobilization, Joint manipulation, Vestibular training, Canalith repositioning, Orthotic/Fit training, DME instructions, Dry Needling, Electrical stimulation, Spinal manipulation, Spinal mobilization, Cryotherapy, Moist heat, Taping, Traction, Ultrasound, Ionotophoresis 4mg /ml Dexamethasone, Manual therapy, and Re-evaluation.  PLAN FOR NEXT SESSION: ***   Venetia Endo, PT, DPT [phone]  Venetia ONEIDA Endo, PT 11/20/2023, 11:37 AM

## 2023-11-20 NOTE — Telephone Encounter (Signed)
 Requested Prescriptions  Pending Prescriptions Disp Refills   buPROPion (WELLBUTRIN XL) 300 MG 24 hr tablet [Pharmacy Med Name: bupropion HCl XL 300 mg 24 hr tablet, extended release] 90 tablet 0    Sig: take 1 tablet by mouth every morning     Psychiatry: Antidepressants - bupropion Failed - 11/20/2023 11:48 AM      Failed - Cr in normal range and within 360 days    No results found for: CREATININE, LABCREAU, LABCREA, POCCRE       Failed - AST in normal range and within 360 days    No results found for: POCAST, AST       Failed - ALT in normal range and within 360 days    No results found for: ALT, LABALT, POCALT       Passed - Completed PHQ-2 or PHQ-9 in the last 360 days      Passed - Last BP in normal range    BP Readings from Last 1 Encounters:  11/05/23 138/79         Passed - Valid encounter within last 6 months    Recent Outpatient Visits           1 month ago Irritable bowel syndrome with diarrhea   [facility], Toribio SQUIBB, [place]   3 months ago Irritable bowel syndrome with diarrhea   [facility] at [facility], Toribio SQUIBB, [place]       Future Appointments             In 4 months Hester Alm BROCKS, MD [place]             lisinopril (ZESTRIL) 20 MG tablet [Pharmacy Med Name: lisinopril 20 mg tablet] 90 tablet 0    Sig: TAKE ONE TABLET BY MOUTH ONCE DAILY     Cardiovascular:  ACE Inhibitors Failed - 11/20/2023 11:48 AM      Failed - Cr in normal range and within 180 days    No results found for: CREATININE, LABCREAU, LABCREA, POCCRE       Failed - K in normal range and within 180 days    No results found for: K, POTASSIUM, POCK       Passed - Patient is not pregnant      Passed - Last BP in normal range    BP Readings from Last 1 Encounters:  11/05/23 138/79         Passed - Valid encounter within last 6  months    Recent Outpatient Visits           1 month ago Irritable bowel syndrome with diarrhea   [facility] at [facility], Toribio SQUIBB, [place]   3 months ago Irritable bowel syndrome with diarrhea   [facility] at [facility], Toribio SQUIBB, [place]       Future Appointments             In 4 months Hester Alm BROCKS, MD [facility]

## 2023-11-21 ENCOUNTER — Encounter: Payer: Self-pay | Admitting: Physical Therapy

## 2023-11-22 ENCOUNTER — Encounter: Payer: Self-pay | Admitting: Physical Therapy

## 2023-11-22 ENCOUNTER — Ambulatory Visit: Admitting: Physical Therapy

## 2023-11-22 DIAGNOSIS — R262 Difficulty in walking, not elsewhere classified: Secondary | ICD-10-CM

## 2023-11-22 DIAGNOSIS — M25511 Pain in right shoulder: Secondary | ICD-10-CM

## 2023-11-22 DIAGNOSIS — M25552 Pain in left hip: Secondary | ICD-10-CM

## 2023-11-22 DIAGNOSIS — M6281 Muscle weakness (generalized): Secondary | ICD-10-CM

## 2023-11-22 NOTE — Therapy (Signed)
 OUTPATIENT PHYSICAL THERAPY TREATMENT  Patient Name: Javier Martin Sr. MRN: [id number] DOB:04-Dec-1960, 63 y.o., male Today's Date: 11/22/2023   END OF SESSION:  PT End of Session - 11/22/23 0858     Visit Number 2    Number of Visits 17    Date for PT Re-Evaluation 01/15/24    Authorization Type [employer] 2025: VL 60 combined PT/OT/Speech    PT Start Time 0859    PT Stop Time 0946    PT Time Calculation (min) 47 min    Activity Tolerance Patient limited by pain    Behavior During Therapy [facility] for tasks assessed/performed           Past Medical History:  Diagnosis Date   Allergy 1960   Anxiety 1960   Asthma 1970   Basal cell carcinoma 03/07/2022   Right inf cheek - [facility]   Dysplastic nevus 02/13/2019   Left low back, 11cm lat. to spine. Mild atypia, peripheral and deep margins involved.    GERD (gastroesophageal reflux disease) 1990   Hypertension 1980   Inactive TB 1999   Sarcoidosis 1997-2000   Sleep apnea 1990   Tuberculosis 1990   Inactive.   Past Surgical History:  Procedure Laterality Date   APPENDECTOMY  1993   COLONOSCOPY WITH PROPOFOL  N/A 04/01/2015   Procedure: COLONOSCOPY WITH PROPOFOL ;  Surgeon: Lamar ONEIDA Holmes, MD;  Location: [facility] ENDOSCOPY;  Service: Endoscopy;  Laterality: N/A;   EYE SURGERY  2010   Lasix   NASAL SEPTUM SURGERY  04/18/2007   pillar procedure  04/18/2007   TONSILLECTOMY     Patient Active Problem List   Diagnosis Date Noted   Generalized osteoarthritis of multiple sites 10/29/2023   History of adenomatous polyp of colon 08/13/2023   Restless legs 08/13/2023   Lymphedema 05/05/2023   Mild asthma 10/11/2020   History of iron deficiency 10/11/2020   GERD (gastroesophageal reflux disease) 07/13/2020   Benign essential HTN 07/13/2020   Sarcoidosis 07/13/2020   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   OSA on CPAP 07/13/2020   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/2020   IBS (irritable bowel syndrome) 07/13/2020   Hypogonadism in male 07/13/2020   ED  (erectile dysfunction) 07/13/2020   Depression with anxiety 07/13/2020   Chronic fatigue 07/13/2020   Decreased testosterone level 05/19/2020    PCP: Toribio SHAUNNA Hoyle, PA  REFERRING PROVIDER: Toribio SHAUNNA Hoyle, PA  REFERRING DIAG: M15.9 (ICD-10-CM) - Generalized osteoarthritis of multiple sites   RATIONALE FOR EVALUATION AND TREATMENT: Rehabilitation  THERAPY DIAG: Bilateral shoulder pain, unspecified chronicity  Bilateral hip pain  Muscle weakness (generalized)  Difficulty in walking, not elsewhere classified  ONSET DATE: 3 months ago  FOLLOW-UP APPT SCHEDULED WITH REFERRING PROVIDER: None for this condition scheduled soon   PERTINENT HISTORY: Pt is a 63 year old male referred from PCP for osteoarthritis of multiple joints; he has Hx of groin pain beginning on R side than hurting L. Then, he had R shoulder pain with eventual progression to L shoulder pain. Pt reports primarily L>R shoulder pain this AM. No numbness/tingling.   Pain began with groin pain with each step. He had R shoulder pain starting after that. He had prednisone  taper that made it better. Patient reports that pain progressed into involving bilateral shoulders and bilateral groin region. Pt reports this past Friday, he started having pain along base of his neck and was having severe pain this past weekend. He reports more popping in his R shoulder over this past week. Pt reports taking magnesium seems to  have helped; condition is better with taking this and resting. Pt reports sarcoidosis diagnosis 25 years ago that led to multi-site joint pain. Pt reports 20 years since significant flare-up of this condition. 50 out of 10 pain when it's flared up.   Pt reports notable pain with attempting to lift his R arm.   PAIN:  Pain Intensity: Present: 3/10, Best: 0-1/10 (on prednisone ), Worst: 10/10 Pain location: L deltoid > R deltoid/ACJ region; R and L groin/medial thigh pain Pain Quality: aching , intermittent sharp  pain with specific motion  Radiating: No  Numbness/Tingling: No N//T Focal Weakness: No Aggravating factors: transferring from bed/chair/couch, rolling in bed, walking, sitting Relieving factors: prednisone , magnesium supplement 24-hour pain behavior: No specific time of day  History of prior shoulder or neck/shoulder injury, pain, surgery, or therapy: No Falls: Has patient fallen in last 6 months? No, Number of falls: N/A Dominant hand: right  Imaging: Yes ;  CLINICAL DATA:  atraumatic right shoulder pain x3wk  EXAM: RIGHT SHOULDER - 2+ VIEW  COMPARISON:  None Available.  FINDINGS: No acute fracture or dislocation. Moderate AC joint space loss. Soft tissues are unremarkable. ...     Narrative & Impression  CLINICAL DATA:  atraumatic right hip/groin pain x3 weeks, painful gait   EXAM: DG HIP (WITH OR WITHOUT PELVIS) 2-3V RIGHT   COMPARISON:  None Available.   FINDINGS: There is no evidence of hip fracture or dislocation of the right hip. No acute displaced fracture left hip on frontal view. No acute displaced fracture or diastasis of the bones of the pelvis. There is no evidence of arthropathy or other focal bone abnormality.   Right lower lobe vascular clips and likely surgical staples.   IMPRESSION: Negative for acute traumatic injury.    Red flags (personal history of cancer, chills/fever, night sweats, nausea, vomiting, unrelenting pain, unexplained weight gain/loss): Positive for skin cancer; removed several months ago   PRECAUTIONS: None  WEIGHT BEARING RESTRICTIONS: No  FALLS: Has patient fallen in last 6 months? 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)  Hobbies: fishing, boating   Patient Goals: Reduce pain, able to go fishing     OBJECTIVE (data from initial evaluation unless otherwise dated):   Patient Surveys  QuickDASH: 75%  Posture Rounded shoulders, inc thoracic kyphosis, forward head  Gait Shortened unipedal stance time bilat, forward flexed posture worse with first few steps  Cervical Screen AROM: see below Spurlings A (ipsilateral lateral flexion/axial compression): R: Negative (pain L UT with R lateral flexion) L: Negative Distraction Test: Negative Hoffman Sign (cervical cord compression): R: Negative L: Negative ULTTs deferred  AROM AROM (Normal range in degrees) AROM 11/20/23  Cervical  Flexion (50) WNL  Extension (80) 30* (pain low back with extension)  Right lateral flexion (45) WNL  Left lateral flexion (45) WNL* (ipsilateral pain affecting medial L UT)   Right rotation (85) WNL  Left rotation (85) WNL* (pain L UT)   Right Left  Shoulder    Flexion 145 158  Extension    Abduction 170 172  External Rotation (arm at side) 56 60  Internal Rotation  [facility] WFL  Hands Behind Head    Hands Behind Back        Elbow    Flexion WNL WNL  Extension WNL WNL  Pronation    Supination    (* = pain; Blank rows = not tested)  UE MMT: MMT (out of 5) Right 11/20/23 Left 11/20/23      Shoulder   Flexion 4-* 4-*  Extension    Abduction 4+ 5-  External rotation 4-* 4-  Internal rotation 5 5  Horizontal abduction    Horizontal adduction    Lower Trapezius    Rhomboids        Elbow  Flexion 5 5  Extension 5 5  Pronation    Supination        (* = pain; Blank rows = not tested)  Sensation Negative for sensory deficit UE/LE   Reflexes Deferred  Palpation Location LEFT  RIGHT           Subocciptials    Cervical paraspinals    Upper Trapezius 0 0  Levator Scapulae    Rhomboid  Major/Minor    Sternoclavicular joint    Acromioclavicular joint 0 0  Coracoid process 0 0  Long head of biceps 0 0  Supraspinatus 0 0  Infraspinatus 0 0  Subscapularis    Teres Minor 0 0  Teres Major    Pectoralis Major 0 0  Pectoralis Minor    Anterior Deltoid 0 0  Lateral Deltoid 0 0  Posterior Deltoid 0 0  Latissimus Dorsi    Sternocleidomastoid    (Blank rows = not tested) Graded on 0-4 scale (0 = no pain, 1 = pain, 2 = pain with wincing/grimacing/flinching, 3 = pain with withdrawal, 4 = unwilling to allow palpation), (Blank rows = not tested)   SPECIAL TESTS Rotator Cuff  Drop Arm Test: Negative Painful Arc (Pain from 60 to 120 degrees scaption): Negative Infraspinatus Muscle Test: Positive If all 3 tests positive, the probability of a full-thickness rotator cuff tear is 91%  Subacromial Impingement Hawkins-Kennedy: Not examined Neer (Block scapula, PROM flexion): Negative Painful Arc (Pain from 60 to 120 degrees scaption): Negative Empty Can: Negative External Rotation Resistance: Positive Horizontal Adduction: Not examined Scapular Assist: Positive (11/22/23)    Bicep Tendon Pathology Speed (shoulder flexion to 90, external rotation, full elbow extension, and forearm supination with resistance: Positive Yergason's (resisted shoulder ER and supination/biceps tendon pathology): R Negative, L Positive (mild pain)   ____________________________________   LOWER QUARTER EXAM 11/22/23   AROM AROM (Normal range in degrees) AROM  11/22/23  Lumbar   Flexion (65) WNL  Extension (30) WNL  Right lateral flexion (25) WNL  Left lateral flexion (25) WNL  Right rotation (30) 75%* (mild R groin)  Left rotation (30) 75%* (mild R groin)      Hip Right Left  Flexion (125) 120* 120*  Extension (15)    Abduction (40) 40* 40  Adduction     Internal Rotation (45) 15 15  External Rotation (45) 40* 40*      (* = pain; Blank rows = not tested)  LE MMT: MMT (out of 5)  Right 11/22/23 Left 11/22/23  Hip flexion 4* 4*  Hip extension 4* 4*  Hip abduction 4-* 4-*  Hip adduction    Hip internal rotation    Hip external rotation    Knee flexion 5 5  Knee extension 5 5  Ankle dorsiflexion    Ankle plantarflexion    Ankle inversion  Ankle eversion    (* = pain; Blank rows = not tested)  Sensation Deferred  Reflexes Deferred  Muscle Length Hamstrings: R: Negative L: Negative Ely (quadriceps): R: Positive L: Positive (no back pain)   Palpation Location Right Left         Lumbar paraspinals    Quadratus Lumborum    Gluteus Maximus 0 0  Gluteus Medius 0 0  Deep hip external rotators    PSIS    Fortin's Area (SIJ)    Greater Trochanter 0 0  Distal iliopsoas 1 1  (Blank rows = not tested) Graded on 0-4 scale (0 = no pain, 1 = pain, 2 = pain with wincing/grimacing/flinching, 3 = pain with withdrawal, 4 = unwilling to allow palpation)  Passive Accessory Intervertebral Motion Deferred  Special Tests Lumbar Radiculopathy and Discogenic: Slump (SN 83, -LR 0.32): R: Not examined L: Not examined SLR (SN 92, -LR 0.29): R: Negative L:  Negative Crossed SLR (SP 90): R: Negative L: Negative Prone Knee Bend: R: Negative L: Negative  Facet Joint: Extension-Rotation (SN 100, -LR 0.0): R: Negative L: Negative  Lumbar Foraminal Stenosis: Lumbar quadrant (SN 70): R: Negative L: Negative  Hip: FABER (SN 81): R: Positive L: Positive FADIR (SN 94): R: Not examined L: Not examined Hip scour (SN 50): R: Positive L: Positive  Piriformis Syndrome: PACE sign: Negative     TODAY'S TREATMENT   11/22/2023   SUBJECTIVE STATEMENT:   Patient reports Magnesium and rest/activity modification period is helping notably; he reports doing better with transferring. Patient reports some pain with external rotation AAROM. He reports he is continuing to improve over last 2 days. Patient reports severe pain and stiffness this past weekend.    Lower quarter/LE  assessment completed (see above in Objective section)   Manual Therapy - for symptom modulation, soft tissue sensitivity and mobility, joint mobility, ROM   R and L shoulder PROM to check for motion loss  -R shoulder: WNL with only end-range flexion and internal rotation deficit  -L shoulder: WNL with only internal rotation deficit  Glenohumeral mobilization: Bilat, posterior and inferior glides gr III; 2 x 30-sec bouts per direction   Therapeutic Exercise - for improved soft tissue flexibility and extensibility as needed for ROM, improved strength as needed to improve performance of CKC activities/functional movements  Serratus punch, in supine; 2 x 10 Serratus slide at wall, with pillow case; 1 x 10 ___________  Supine butterfly stretch; 2 x 30 sec  Half kneel hip flexor stretch; x 30 sec each side  PATIENT EDUCATION: Discussed examination findings today, ruling out lumbar spine contribution. HEP updated and reviewed.     PATIENT EDUCATION:  Education details: see above for patient education details Person educated: Patient Education method: Explanation, Demonstration, and Handouts Education comprehension: verbalized understanding and returned demonstration   HOME EXERCISE PROGRAM:  Access Code: [email or web address] URL: [email or web address] Date: 11/20/2023 Prepared by: Venetia Endo  Exercises - Supine Shoulder Flexion with Dowel  - 2 x daily - 7 x weekly - 2 sets - 10 reps - Supine Shoulder External Rotation with Dowel  - 2 x daily - 7 x weekly - 2 sets - 10 reps - Seated Scapular Retraction  - 2 x daily - 7 x weekly - 2 sets - 10 reps - 3sec hold  ASSESSMENT:  CLINICAL IMPRESSION: Patient fortunately is making notable progress recently that he attributes to period of relative rest and using Magnesium supplement. Patient has notably improved activity tolerance and exhibits less  difficulty with bed mobility and transferring tasks. We are able to attain grossly  [facility] shoulder ROM bilat with manual therapy today. Pt has (+) scapular assistance test, and his program was updated today for serratus activation/scapular control with shoulder elevation. We completed lower quarter/LE testing today with (-) provocative tests for lumbar spine. (+) FABER and Scour (R side worse than L). HEP was updated for hip flexor and adductor stretching. Pt has current deficits in: reaching, lifting, bed mobility, shoulder ROM, deltoid and posterior cuff strength, postural changes; LE deficits in hip ROM, hip flexor and gluteal strength, coxofemoral stiffness, anterior hip/groin pain, and gait changes. Pt will continue to benefit from skilled PT services to address deficits and improve function.   OBJECTIVE IMPAIRMENTS: Abnormal gait, decreased mobility, difficulty walking, decreased ROM, decreased strength, hypomobility, impaired flexibility, impaired UE functional use, postural dysfunction, and pain.   ACTIVITY LIMITATIONS: carrying, lifting, bending, stairs, transfers, bed mobility, sleeping, and reach over head  PARTICIPATION LIMITATIONS: shopping, community activity, occupation, yard work, and fishing/boating participation  PERSONAL FACTORS: Past/current experiences, Profession, and 3+ comorbidities: (depression/anxiety, HTN) are also affecting patient's functional outcome.   REHAB POTENTIAL: Good  CLINICAL DECISION MAKING: Unstable/unpredictable  EVALUATION COMPLEXITY: High   GOALS: Goals reviewed with patient? Yes  SHORT TERM GOALS: Target date: 12/12/2023  Pt will be independent with HEP to improve strength and decrease shoulder pain to improve pain-free function at home and work. Baseline: 11/20/23: Baseline HEP initiated for upper body; will further develop LE HEP at future f/u.  Goal status: INITIAL   LONG TERM GOALS: Target date: 01/17/2024  Pt will have shoulder forward flexion to 160 deg or greater bilaterally and functional ER/IR WFL without reproduction of  pain as needed for overhead activity and functional reaching tasks Baseline: 11/20/23: Motion loss in forward elevation and ER.  Goal status: INITIAL  2.  Pt will decrease worst shoulder pain by at least 3 points on the NPRS in order to demonstrate clinically significant reduction in shoulder pain. Baseline: 11/20/23: 10/10 at worst.  Goal status: INITIAL  3.  Pt will decrease quick DASH score to no more than 20% in order to demonstrate clinically significant reduction in disability related to shoulder pain       Baseline: 11/20/23: 75% Goal status: INITIAL  4. Pt will increase strength of deltoid and posterior cuff mm to at least 4+/5 or greater MMT grade in order to demonstrate improvement in strength and function Baseline: 11/20/23: Shoulder flexion and ER 4-/5 with pain reproduction.  Goal status: INITIAL  5. Pt will improve strength of flexors, abductors, extensors to 4+/5 or greater MMT grade indicative of improved proximal/hip strength as needed for improved tolerance of load-bearing activities and transferring Baseline: 11/22/23: Shoulder flexion and ER 4-/5 with pain reproduction.  Goal status: INITIAL  6. Pt will have pain-free sit to stand and stand-to-sit transfer without reproduction of pain as needed for household and community mobility and getting up/down from office chair during workday.  Baseline: 11/22/23: Notable pain behaviors with transfers. Goal status: INITIAL  PLAN: PT FREQUENCY: 1-2x/week  PT DURATION: 8 weeks  PLANNED INTERVENTIONS: Therapeutic exercises, Therapeutic activity, Neuromuscular re-education, Balance training, Gait training, Patient/Family education, Self Care, Joint mobilization, Joint manipulation, Vestibular training, Canalith repositioning, Orthotic/Fit training, DME instructions, Dry Needling, Electrical stimulation, Spinal manipulation, Spinal mobilization, Cryotherapy, Moist heat, Taping, Traction, Ultrasound, Ionotophoresis 4mg /ml Dexamethasone, Manual  therapy, and Re-evaluation.  PLAN FOR NEXT SESSION: Continue with manual techniques for GJH and scapular mobility. AAROM drills and posterior  cuff and periscapular isotonics as tolerated. Serratus activation and scapular stability. Hip flexor and adductor stretching; gluteal strengthening with low-impact drills initially. Manual techniques/Mulligan mobilizations for hips prn.    Venetia Endo, PT, DPT #[id number]  Venetia ONEIDA Endo, PT 11/22/2023, 12:10 PM

## 2023-11-27 ENCOUNTER — Ambulatory Visit: Admitting: Physical Therapy

## 2023-11-27 ENCOUNTER — Encounter: Payer: Self-pay | Admitting: Physical Therapy

## 2023-11-27 DIAGNOSIS — M25511 Pain in right shoulder: Secondary | ICD-10-CM

## 2023-11-27 DIAGNOSIS — M6281 Muscle weakness (generalized): Secondary | ICD-10-CM

## 2023-11-27 DIAGNOSIS — M25551 Pain in right hip: Secondary | ICD-10-CM

## 2023-11-27 DIAGNOSIS — R262 Difficulty in walking, not elsewhere classified: Secondary | ICD-10-CM

## 2023-11-27 NOTE — Therapy (Signed)
 OUTPATIENT PHYSICAL THERAPY TREATMENT  Patient Name: Javier PAVEL Sr. MRN: [id number] DOB:December 14, 1960, 62 y.o., male Today's Date: 11/27/2023   END OF SESSION:  PT End of Session - 11/27/23 0903     Visit Number 3    Number of Visits 17    Date for PT Re-Evaluation 01/15/24    Authorization Type [employer] 2025: VL 60 combined PT/OT/Speech    PT Start Time 0903    PT Stop Time 0948    PT Time Calculation (min) 45 min    Activity Tolerance Patient limited by pain    Behavior During Therapy [facility] for tasks assessed/performed            Past Medical History:  Diagnosis Date   Allergy 1960   Anxiety 1960   Asthma 1970   Basal cell carcinoma 03/07/2022   Right inf cheek - [facility]   Dysplastic nevus 02/13/2019   Left low back, 11cm lat. to spine. Mild atypia, peripheral and deep margins involved.    GERD (gastroesophageal reflux disease) 1990   Hypertension 1980   Inactive TB 1999   Sarcoidosis 1997-2000   Sleep apnea 1990   Tuberculosis 1990   Inactive.   Past Surgical History:  Procedure Laterality Date   APPENDECTOMY  1993   COLONOSCOPY WITH PROPOFOL  N/A 04/01/2015   Procedure: COLONOSCOPY WITH PROPOFOL ;  Surgeon: Lamar ONEIDA Holmes, MD;  Location: [facility] ENDOSCOPY;  Service: Endoscopy;  Laterality: N/A;   EYE SURGERY  2010   Lasix   NASAL SEPTUM SURGERY  04/18/2007   pillar procedure  04/18/2007   TONSILLECTOMY     Patient Active Problem List   Diagnosis Date Noted   Generalized osteoarthritis of multiple sites 10/29/2023   History of adenomatous polyp of colon 08/13/2023   Restless legs 08/13/2023   Lymphedema 05/05/2023   Mild asthma 10/11/2020   History of iron deficiency 10/11/2020   GERD (gastroesophageal reflux disease) 07/13/2020   Benign essential HTN 07/13/2020   Sarcoidosis 07/13/2020   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   OSA on CPAP 07/13/2020   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/2020   IBS (irritable bowel syndrome) 07/13/2020   Hypogonadism in male 07/13/2020   ED  (erectile dysfunction) 07/13/2020   Depression with anxiety 07/13/2020   Chronic fatigue 07/13/2020   Decreased testosterone level 05/19/2020    PCP: Toribio SHAUNNA Hoyle, PA  REFERRING PROVIDER: Toribio SHAUNNA Hoyle, PA  REFERRING DIAG: M15.9 (ICD-10-CM) - Generalized osteoarthritis of multiple sites   RATIONALE FOR EVALUATION AND TREATMENT: Rehabilitation  THERAPY DIAG: Bilateral shoulder pain, unspecified chronicity  Bilateral hip pain  Muscle weakness (generalized)  Difficulty in walking, not elsewhere classified  ONSET DATE: 3 months ago  FOLLOW-UP APPT SCHEDULED WITH REFERRING PROVIDER: None for this condition scheduled soon   PERTINENT HISTORY: Pt is a 63 year old male referred from PCP for osteoarthritis of multiple joints; he has Hx of groin pain beginning on R side than hurting L. Then, he had R shoulder pain with eventual progression to L shoulder pain. Pt reports primarily L>R shoulder pain this AM. No numbness/tingling.   Pain began with groin pain with each step. He had R shoulder pain starting after that. He had prednisone  taper that made it better. Patient reports that pain progressed into involving bilateral shoulders and bilateral groin region. Pt reports this past Friday, he started having pain along base of his neck and was having severe pain this past weekend. He reports more popping in his R shoulder over this past week. Pt reports taking magnesium seems  to have helped; condition is better with taking this and resting. Pt reports sarcoidosis diagnosis 25 years ago that led to multi-site joint pain. Pt reports 20 years since significant flare-up of this condition. 50 out of 10 pain when it's flared up.   Pt reports notable pain with attempting to lift his R arm.   PAIN:  Pain Intensity: Present: 3/10, Best: 0-1/10 (on prednisone ), Worst: 10/10 Pain location: L deltoid > R deltoid/ACJ region; R and L groin/medial thigh pain Pain Quality: aching , intermittent sharp  pain with specific motion  Radiating: No  Numbness/Tingling: No N//T Focal Weakness: No Aggravating factors: transferring from bed/chair/couch, rolling in bed, walking, sitting Relieving factors: prednisone , magnesium supplement 24-hour pain behavior: No specific time of day  History of prior shoulder or neck/shoulder injury, pain, surgery, or therapy: No Falls: Has patient fallen in last 6 months? No, Number of falls: N/A Dominant hand: right  Imaging: Yes ;  CLINICAL DATA:  atraumatic right shoulder pain x3wk  EXAM: RIGHT SHOULDER - 2+ VIEW  COMPARISON:  None Available.  FINDINGS: No acute fracture or dislocation. Moderate AC joint space loss. Soft tissues are unremarkable. ...     Narrative & Impression  CLINICAL DATA:  atraumatic right hip/groin pain x3 weeks, painful gait   EXAM: DG HIP (WITH OR WITHOUT PELVIS) 2-3V RIGHT   COMPARISON:  None Available.   FINDINGS: There is no evidence of hip fracture or dislocation of the right hip. No acute displaced fracture left hip on frontal view. No acute displaced fracture or diastasis of the bones of the pelvis. There is no evidence of arthropathy or other focal bone abnormality.   Right lower lobe vascular clips and likely surgical staples.   IMPRESSION: Negative for acute traumatic injury.    Red flags (personal history of cancer, chills/fever, night sweats, nausea, vomiting, unrelenting pain, unexplained weight gain/loss): Positive for skin cancer; removed several months ago   PRECAUTIONS: None  WEIGHT BEARING RESTRICTIONS: No  FALLS: Has patient fallen in last 6 months? 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)  Hobbies: fishing, boating   Patient Goals: Reduce pain, able to go fishing     OBJECTIVE (data from initial evaluation unless otherwise dated):   Patient Surveys  QuickDASH: 75%  Posture Rounded shoulders, inc thoracic kyphosis, forward head  Gait Shortened unipedal stance time bilat, forward flexed posture worse with first few steps  Cervical Screen AROM: see below Spurlings A (ipsilateral lateral flexion/axial compression): R: Negative (pain L UT with R lateral flexion) L: Negative Distraction Test: Negative Hoffman Sign (cervical cord compression): R: Negative L: Negative ULTTs deferred  AROM AROM (Normal range in degrees) AROM 11/20/23  Cervical  Flexion (50) WNL  Extension (80) 30* (pain low back with extension)  Right lateral flexion (45) WNL  Left lateral flexion (45) WNL* (ipsilateral pain affecting medial L UT)   Right rotation (85) WNL  Left rotation (85) WNL* (pain L UT)   Right Left  Shoulder    Flexion 145 158  Extension    Abduction 170 172  External Rotation (arm at side) 56 60  Internal  Rotation [facility] WFL  Hands Behind Head    Hands Behind Back        Elbow    Flexion WNL WNL  Extension WNL WNL  Pronation    Supination    (* = pain; Blank rows = not tested)  UE MMT: MMT (out of 5) Right 11/20/23 Left 11/20/23      Shoulder   Flexion 4-* 4-*  Extension    Abduction 4+ 5-  External rotation 4-* 4-  Internal rotation 5 5  Horizontal abduction    Horizontal adduction    Lower Trapezius    Rhomboids        Elbow  Flexion 5 5  Extension 5 5  Pronation    Supination        (* = pain; Blank rows = not tested)  Sensation Negative for sensory deficit UE/LE   Reflexes Deferred  Palpation Location LEFT  RIGHT           Subocciptials    Cervical paraspinals    Upper Trapezius 0 0  Levator Scapulae    Rhomboid  Major/Minor    Sternoclavicular joint    Acromioclavicular joint 0 0  Coracoid process 0 0  Long head of biceps 0 0  Supraspinatus 0 0  Infraspinatus 0 0  Subscapularis    Teres Minor 0 0  Teres Major    Pectoralis Major 0 0  Pectoralis Minor    Anterior Deltoid 0 0  Lateral Deltoid 0 0  Posterior Deltoid 0 0  Latissimus Dorsi    Sternocleidomastoid    (Blank rows = not tested) Graded on 0-4 scale (0 = no pain, 1 = pain, 2 = pain with wincing/grimacing/flinching, 3 = pain with withdrawal, 4 = unwilling to allow palpation), (Blank rows = not tested)   SPECIAL TESTS Rotator Cuff  Drop Arm Test: Negative Painful Arc (Pain from 60 to 120 degrees scaption): Negative Infraspinatus Muscle Test: Positive If all 3 tests positive, the probability of a full-thickness rotator cuff tear is 91%  Subacromial Impingement Hawkins-Kennedy: Not examined Neer (Block scapula, PROM flexion): Negative Painful Arc (Pain from 60 to 120 degrees scaption): Negative Empty Can: Negative External Rotation Resistance: Positive Horizontal Adduction: Not examined Scapular Assist: Positive (11/22/23)    Bicep Tendon Pathology Speed (shoulder flexion to 90, external rotation, full elbow extension, and forearm supination with resistance: Positive Yergason's (resisted shoulder ER and supination/biceps tendon pathology): R Negative, L Positive (mild pain)   ____________________________________   LOWER QUARTER EXAM 11/22/23   AROM AROM (Normal range in degrees) AROM  11/22/23  Lumbar   Flexion (65) WNL  Extension (30) WNL  Right lateral flexion (25) WNL  Left lateral flexion (25) WNL  Right rotation (30) 75%* (mild R groin)  Left rotation (30) 75%* (mild R groin)      Hip Right Left  Flexion (125) 120* 120*  Extension (15)    Abduction (40) 40* 40  Adduction     Internal Rotation (45) 15 15  External Rotation (45) 40* 40*      (* = pain; Blank rows = not tested)  LE MMT: MMT (out of 5)  Right 11/22/23 Left 11/22/23  Hip flexion 4* 4*  Hip extension 4* 4*  Hip abduction 4-* 4-*  Hip adduction    Hip internal rotation    Hip external rotation    Knee flexion 5 5  Knee extension 5 5  Ankle dorsiflexion    Ankle plantarflexion    Ankle  inversion    Ankle eversion    (* = pain; Blank rows = not tested)  Sensation Deferred  Reflexes Deferred  Muscle Length Hamstrings: R: Negative L: Negative Ely (quadriceps): R: Positive L: Positive (no back pain)   Palpation Location Right Left         Lumbar paraspinals    Quadratus Lumborum    Gluteus Maximus 0 0  Gluteus Medius 0 0  Deep hip external rotators    PSIS    Fortin's Area (SIJ)    Greater Trochanter 0 0  Distal iliopsoas 1 1  (Blank rows = not tested) Graded on 0-4 scale (0 = no pain, 1 = pain, 2 = pain with wincing/grimacing/flinching, 3 = pain with withdrawal, 4 = unwilling to allow palpation)  Passive Accessory Intervertebral Motion Deferred  Special Tests Lumbar Radiculopathy and Discogenic: Slump (SN 83, -LR 0.32): R: Not examined L: Not examined SLR (SN 92, -LR 0.29): R: Negative L:  Negative Crossed SLR (SP 90): R: Negative L: Negative Prone Knee Bend: R: Negative L: Negative  Facet Joint: Extension-Rotation (SN 100, -LR 0.0): R: Negative L: Negative  Lumbar Foraminal Stenosis: Lumbar quadrant (SN 70): R: Negative L: Negative  Hip: FABER (SN 81): R: Positive L: Positive FADIR (SN 94): R: Not examined L: Not examined Hip scour (SN 50): R: Positive L: Positive  Piriformis Syndrome: PACE sign: Negative     TODAY'S TREATMENT   11/27/2023   SUBJECTIVE STATEMENT:   Patient reports 7/10 pain at arrival. He reports doing well until Sunday. He reports mild soreness after last visit, but not bad. Patient reports tolerating stretches generally well until yesterday. Patient reports hurting with much movement in general this AM. He reports notable fatigue through the weekend. No other unusual  S&S recently.     Manual Therapy - for symptom modulation, soft tissue sensitivity and mobility, joint mobility, ROM   R shoulder glenohumeral distraction, Kaltenborn gr III; 3 x 30 sec bouts R and L shoulder PROM to check for motion loss  -R shoulder: WNL with only end-range flexion and internal rotation deficit; intermittent pinch with shoulder complex abduction  -L shoulder: WNL Glenohumeral mobilization: R shoulder today, posterior and inferior glides gr III; 2 x 30-sec bouts per direction  ___________  Bilat hip PROM to check for motion loss, x 1 min on ea side Mulligan belt lateral distraction 3 x 30-sec bouts, bilat MWM R hip; lateral distraction with hip flexion and ER as tolerated    Therapeutic Exercise - for improved soft tissue flexibility and extensibility as needed for ROM, improved strength as needed to improve performance of CKC activities/functional movements  Supine wand ER AAROM; x20 on R and L UE Pulleys, forward elevation; x 2 min ___________  Supine butterfly stretch; 2 x 30 sec    PATIENT EDUCATION: Discussed ongoing HEP and use of OTC medications as advised by MD/PA and Magnesium supplement. Discussed possible f/u with rheumatology if symptoms persist with conservative care.     PATIENT EDUCATION:  Education details: see above for patient education details Person educated: Patient Education method: Explanation, Demonstration, and Handouts Education comprehension: verbalized understanding and returned demonstration   HOME EXERCISE PROGRAM:  Access Code: [email or web address] URL: [email or web address] Date: 11/20/2023 Prepared by: Venetia Endo  Exercises - Supine Shoulder Flexion with Dowel  - 2 x daily - 7 x weekly - 2 sets - 10 reps - Supine Shoulder External Rotation with Dowel  - 2 x daily - 7 x weekly - 2 sets -  10 reps - Seated Scapular Retraction  - 2 x daily - 7 x weekly - 2 sets - 10 reps - 3sec hold  ASSESSMENT:  CLINICAL  IMPRESSION: Patient was doing very well at end of last week with mild symptoms; he reports virtually no pain on Saturday, but notably worsening symptoms Sunday afternoon. Pt reports no specific aggravating factors/activities; he did mow his lawn, but did not feel that this bothered him. We are able to attain [facility] bilat shoulder ROM with manual therapy today. Pt has marked R>L hip stiffness and antalgic gait that persists through session today. We discussed continued stretching program and activity modification prn. Pt has current deficits in: reaching, lifting, bed mobility, shoulder ROM, deltoid and posterior cuff strength, postural changes; LE deficits in hip ROM, hip flexor and gluteal strength, coxofemoral stiffness, anterior hip/groin pain, and gait changes. Pt will continue to benefit from skilled PT services to address deficits and improve function.   OBJECTIVE IMPAIRMENTS: Abnormal gait, decreased mobility, difficulty walking, decreased ROM, decreased strength, hypomobility, impaired flexibility, impaired UE functional use, postural dysfunction, and pain.   ACTIVITY LIMITATIONS: carrying, lifting, bending, stairs, transfers, bed mobility, sleeping, and reach over head  PARTICIPATION LIMITATIONS: shopping, community activity, occupation, yard work, and fishing/boating participation  PERSONAL FACTORS: Past/current experiences, Profession, and 3+ comorbidities: (depression/anxiety, HTN) are also affecting patient's functional outcome.   REHAB POTENTIAL: Good  CLINICAL DECISION MAKING: Unstable/unpredictable  EVALUATION COMPLEXITY: High   GOALS: Goals reviewed with patient? Yes  SHORT TERM GOALS: Target date: 12/12/2023  Pt will be independent with HEP to improve strength and decrease shoulder pain to improve pain-free function at home and work. Baseline: 11/20/23: Baseline HEP initiated for upper body; will further develop LE HEP at future f/u.  Goal status: INITIAL   LONG TERM GOALS:  Target date: 01/17/2024  Pt will have shoulder forward flexion to 160 deg or greater bilaterally and functional ER/IR WFL without reproduction of pain as needed for overhead activity and functional reaching tasks Baseline: 11/20/23: Motion loss in forward elevation and ER.  Goal status: INITIAL  2.  Pt will decrease worst shoulder pain by at least 3 points on the NPRS in order to demonstrate clinically significant reduction in shoulder pain. Baseline: 11/20/23: 10/10 at worst.  Goal status: INITIAL  3.  Pt will decrease quick DASH score to no more than 20% in order to demonstrate clinically significant reduction in disability related to shoulder pain       Baseline: 11/20/23: 75% Goal status: INITIAL  4. Pt will increase strength of deltoid and posterior cuff mm to at least 4+/5 or greater MMT grade in order to demonstrate improvement in strength and function Baseline: 11/20/23: Shoulder flexion and ER 4-/5 with pain reproduction.  Goal status: INITIAL  5. Pt will improve strength of flexors, abductors, extensors to 4+/5 or greater MMT grade indicative of improved proximal/hip strength as needed for improved tolerance of load-bearing activities and transferring Baseline: 11/22/23: Shoulder flexion and ER 4-/5 with pain reproduction.  Goal status: INITIAL  6. Pt will have pain-free sit to stand and stand-to-sit transfer without reproduction of pain as needed for household and community mobility and getting up/down from office chair during workday.  Baseline: 11/22/23: Notable pain behaviors with transfers. Goal status: INITIAL  PLAN: PT FREQUENCY: 1-2x/week  PT DURATION: 8 weeks  PLANNED INTERVENTIONS: Therapeutic exercises, Therapeutic activity, Neuromuscular re-education, Balance training, Gait training, Patient/Family education, Self Care, Joint mobilization, Joint manipulation, Vestibular training, Canalith repositioning, Orthotic/Fit training, DME instructions, Dry  Needling, Electrical  stimulation, Spinal manipulation, Spinal mobilization, Cryotherapy, Moist heat, Taping, Traction, Ultrasound, Ionotophoresis 4mg /ml Dexamethasone, Manual therapy, and Re-evaluation.  PLAN FOR NEXT SESSION: Continue with manual techniques for GJH and scapular mobility. AAROM drills and posterior cuff and periscapular isotonics as tolerated. Serratus activation and scapular stability. Hip flexor and adductor stretching; gluteal strengthening with low-impact drills initially. Manual techniques/Mulligan mobilizations for hips prn.    Venetia Endo, PT, DPT #[id number]  Venetia ONEIDA Endo, PT 11/27/2023, 9:47 AM

## 2023-11-27 NOTE — Therapy (Signed)
 OUTPATIENT PHYSICAL THERAPY TREATMENT  Patient Name: Javier PAVEL Sr. MRN: [id number] DOB:December 14, 1960, 63 y.o., male Today's Date: 11/27/2023   END OF SESSION:  PT End of Session - 11/27/23 0903     Visit Number 3    Number of Visits 17    Date for PT Re-Evaluation 01/15/24    Authorization Type [employer] 2025: VL 60 combined PT/OT/Speech    PT Start Time 0903    PT Stop Time 0948    PT Time Calculation (min) 45 min    Activity Tolerance Patient limited by pain    Behavior During Therapy [facility] for tasks assessed/performed            Past Medical History:  Diagnosis Date   Allergy 1960   Anxiety 1960   Asthma 1970   Basal cell carcinoma 03/07/2022   Right inf cheek - [facility]   Dysplastic nevus 02/13/2019   Left low back, 11cm lat. to spine. Mild atypia, peripheral and deep margins involved.    GERD (gastroesophageal reflux disease) 1990   Hypertension 1980   Inactive TB 1999   Sarcoidosis 1997-2000   Sleep apnea 1990   Tuberculosis 1990   Inactive.   Past Surgical History:  Procedure Laterality Date   APPENDECTOMY  1993   COLONOSCOPY WITH PROPOFOL  N/A 04/01/2015   Procedure: COLONOSCOPY WITH PROPOFOL ;  Surgeon: Lamar ONEIDA Holmes, MD;  Location: [facility] ENDOSCOPY;  Service: Endoscopy;  Laterality: N/A;   EYE SURGERY  2010   Lasix   NASAL SEPTUM SURGERY  04/18/2007   pillar procedure  04/18/2007   TONSILLECTOMY     Patient Active Problem List   Diagnosis Date Noted   Generalized osteoarthritis of multiple sites 10/29/2023   History of adenomatous polyp of colon 08/13/2023   Restless legs 08/13/2023   Lymphedema 05/05/2023   Mild asthma 10/11/2020   History of iron deficiency 10/11/2020   GERD (gastroesophageal reflux disease) 07/13/2020   Benign essential HTN 07/13/2020   Sarcoidosis 07/13/2020   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   OSA on CPAP 07/13/2020   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/2020   IBS (irritable bowel syndrome) 07/13/2020   Hypogonadism in male 07/13/2020   ED  (erectile dysfunction) 07/13/2020   Depression with anxiety 07/13/2020   Chronic fatigue 07/13/2020   Decreased testosterone level 05/19/2020    PCP: Toribio SHAUNNA Hoyle, PA  REFERRING PROVIDER: Toribio SHAUNNA Hoyle, PA  REFERRING DIAG: M15.9 (ICD-10-CM) - Generalized osteoarthritis of multiple sites   RATIONALE FOR EVALUATION AND TREATMENT: Rehabilitation  THERAPY DIAG: Bilateral shoulder pain, unspecified chronicity  Bilateral hip pain  Muscle weakness (generalized)  Difficulty in walking, not elsewhere classified  ONSET DATE: 3 months ago  FOLLOW-UP APPT SCHEDULED WITH REFERRING PROVIDER: None for this condition scheduled soon   PERTINENT HISTORY: Pt is a 63 year old male referred from PCP for osteoarthritis of multiple joints; he has Hx of groin pain beginning on R side than hurting L. Then, he had R shoulder pain with eventual progression to L shoulder pain. Pt reports primarily L>R shoulder pain this AM. No numbness/tingling.   Pain began with groin pain with each step. He had R shoulder pain starting after that. He had prednisone  taper that made it better. Patient reports that pain progressed into involving bilateral shoulders and bilateral groin region. Pt reports this past Friday, he started having pain along base of his neck and was having severe pain this past weekend. He reports more popping in his R shoulder over this past week. Pt reports taking magnesium seems  to have helped; condition is better with taking this and resting. Pt reports sarcoidosis diagnosis 25 years ago that led to multi-site joint pain. Pt reports 20 years since significant flare-up of this condition. 50 out of 10 pain when it's flared up.   Pt reports notable pain with attempting to lift his R arm.   PAIN:  Pain Intensity: Present: 3/10, Best: 0-1/10 (on prednisone ), Worst: 10/10 Pain location: L deltoid > R deltoid/ACJ region; R and L groin/medial thigh pain Pain Quality: aching , intermittent sharp  pain with specific motion  Radiating: No  Numbness/Tingling: No N//T Focal Weakness: No Aggravating factors: transferring from bed/chair/couch, rolling in bed, walking, sitting Relieving factors: prednisone , magnesium supplement 24-hour pain behavior: No specific time of day  History of prior shoulder or neck/shoulder injury, pain, surgery, or therapy: No Falls: Has patient fallen in last 6 months? No, Number of falls: N/A Dominant hand: right  Imaging: Yes ;  CLINICAL DATA:  atraumatic right shoulder pain x3wk  EXAM: RIGHT SHOULDER - 2+ VIEW  COMPARISON:  None Available.  FINDINGS: No acute fracture or dislocation. Moderate AC joint space loss. Soft tissues are unremarkable. ...     Narrative & Impression  CLINICAL DATA:  atraumatic right hip/groin pain x3 weeks, painful gait   EXAM: DG HIP (WITH OR WITHOUT PELVIS) 2-3V RIGHT   COMPARISON:  None Available.   FINDINGS: There is no evidence of hip fracture or dislocation of the right hip. No acute displaced fracture left hip on frontal view. No acute displaced fracture or diastasis of the bones of the pelvis. There is no evidence of arthropathy or other focal bone abnormality.   Right lower lobe vascular clips and likely surgical staples.   IMPRESSION: Negative for acute traumatic injury.    Red flags (personal history of cancer, chills/fever, night sweats, nausea, vomiting, unrelenting pain, unexplained weight gain/loss): Positive for skin cancer; removed several months ago   PRECAUTIONS: None  WEIGHT BEARING RESTRICTIONS: No  FALLS: Has patient fallen in last 6 months? 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)  Hobbies: fishing, boating   Patient Goals: Reduce pain, able to go fishing     OBJECTIVE (data from initial evaluation unless otherwise dated):   Patient Surveys  QuickDASH: 75%  Posture Rounded shoulders, inc thoracic kyphosis, forward head  Gait Shortened unipedal stance time bilat, forward flexed posture worse with first few steps  Cervical Screen AROM: see below Spurlings A (ipsilateral lateral flexion/axial compression): R: Negative (pain L UT with R lateral flexion) L: Negative Distraction Test: Negative Hoffman Sign (cervical cord compression): R: Negative L: Negative ULTTs deferred  AROM AROM (Normal range in degrees) AROM 11/20/23  Cervical  Flexion (50) WNL  Extension (80) 30* (pain low back with extension)  Right lateral flexion (45) WNL  Left lateral flexion (45) WNL* (ipsilateral pain affecting medial L UT)   Right rotation (85) WNL  Left rotation (85) WNL* (pain L UT)   Right Left  Shoulder    Flexion 145 158  Extension    Abduction 170 172  External Rotation (arm at side) 56 60  Internal  Rotation [facility] WFL  Hands Behind Head    Hands Behind Back        Elbow    Flexion WNL WNL  Extension WNL WNL  Pronation    Supination    (* = pain; Blank rows = not tested)  UE MMT: MMT (out of 5) Right 11/20/23 Left 11/20/23      Shoulder   Flexion 4-* 4-*  Extension    Abduction 4+ 5-  External rotation 4-* 4-  Internal rotation 5 5  Horizontal abduction    Horizontal adduction    Lower Trapezius    Rhomboids        Elbow  Flexion 5 5  Extension 5 5  Pronation    Supination        (* = pain; Blank rows = not tested)  Sensation Negative for sensory deficit UE/LE   Reflexes Deferred  Palpation Location LEFT  RIGHT           Subocciptials    Cervical paraspinals    Upper Trapezius 0 0  Levator Scapulae    Rhomboid  Major/Minor    Sternoclavicular joint    Acromioclavicular joint 0 0  Coracoid process 0 0  Long head of biceps 0 0  Supraspinatus 0 0  Infraspinatus 0 0  Subscapularis    Teres Minor 0 0  Teres Major    Pectoralis Major 0 0  Pectoralis Minor    Anterior Deltoid 0 0  Lateral Deltoid 0 0  Posterior Deltoid 0 0  Latissimus Dorsi    Sternocleidomastoid    (Blank rows = not tested) Graded on 0-4 scale (0 = no pain, 1 = pain, 2 = pain with wincing/grimacing/flinching, 3 = pain with withdrawal, 4 = unwilling to allow palpation), (Blank rows = not tested)   SPECIAL TESTS Rotator Cuff  Drop Arm Test: Negative Painful Arc (Pain from 60 to 120 degrees scaption): Negative Infraspinatus Muscle Test: Positive If all 3 tests positive, the probability of a full-thickness rotator cuff tear is 91%  Subacromial Impingement Hawkins-Kennedy: Not examined Neer (Block scapula, PROM flexion): Negative Painful Arc (Pain from 60 to 120 degrees scaption): Negative Empty Can: Negative External Rotation Resistance: Positive Horizontal Adduction: Not examined Scapular Assist: Positive (11/22/23)    Bicep Tendon Pathology Speed (shoulder flexion to 90, external rotation, full elbow extension, and forearm supination with resistance: Positive Yergason's (resisted shoulder ER and supination/biceps tendon pathology): R Negative, L Positive (mild pain)   ____________________________________   LOWER QUARTER EXAM 11/22/23   AROM AROM (Normal range in degrees) AROM  11/22/23  Lumbar   Flexion (65) WNL  Extension (30) WNL  Right lateral flexion (25) WNL  Left lateral flexion (25) WNL  Right rotation (30) 75%* (mild R groin)  Left rotation (30) 75%* (mild R groin)      Hip Right Left  Flexion (125) 120* 120*  Extension (15)    Abduction (40) 40* 40  Adduction     Internal Rotation (45) 15 15  External Rotation (45) 40* 40*      (* = pain; Blank rows = not tested)  LE MMT: MMT (out of 5)  Right 11/22/23 Left 11/22/23  Hip flexion 4* 4*  Hip extension 4* 4*  Hip abduction 4-* 4-*  Hip adduction    Hip internal rotation    Hip external rotation    Knee flexion 5 5  Knee extension 5 5  Ankle dorsiflexion    Ankle plantarflexion    Ankle  inversion    Ankle eversion    (* = pain; Blank rows = not tested)  Sensation Deferred  Reflexes Deferred  Muscle Length Hamstrings: R: Negative L: Negative Ely (quadriceps): R: Positive L: Positive (no back pain)   Palpation Location Right Left         Lumbar paraspinals    Quadratus Lumborum    Gluteus Maximus 0 0  Gluteus Medius 0 0  Deep hip external rotators    PSIS    Fortin's Area (SIJ)    Greater Trochanter 0 0  Distal iliopsoas 1 1  (Blank rows = not tested) Graded on 0-4 scale (0 = no pain, 1 = pain, 2 = pain with wincing/grimacing/flinching, 3 = pain with withdrawal, 4 = unwilling to allow palpation)  Passive Accessory Intervertebral Motion Deferred  Special Tests Lumbar Radiculopathy and Discogenic: Slump (SN 83, -LR 0.32): R: Not examined L: Not examined SLR (SN 92, -LR 0.29): R: Negative L:  Negative Crossed SLR (SP 90): R: Negative L: Negative Prone Knee Bend: R: Negative L: Negative  Facet Joint: Extension-Rotation (SN 100, -LR 0.0): R: Negative L: Negative  Lumbar Foraminal Stenosis: Lumbar quadrant (SN 70): R: Negative L: Negative  Hip: FABER (SN 81): R: Positive L: Positive FADIR (SN 94): R: Not examined L: Not examined Hip scour (SN 50): R: Positive L: Positive  Piriformis Syndrome: PACE sign: Negative     TODAY'S TREATMENT   11/27/2023   SUBJECTIVE STATEMENT:   Patient reports 7/10 pain at arrival. He reports doing well until Sunday. He reports mild soreness after last visit, but not bad. Patient reports tolerating stretches generally well until yesterday. Patient reports hurting with much movement in general this AM. He reports notable fatigue through the weekend. No other unusual  S&S recently.     Manual Therapy - for symptom modulation, soft tissue sensitivity and mobility, joint mobility, ROM   R shoulder glenohumeral distraction, Kaltenborn gr III; 3 x 30 sec bouts R and L shoulder PROM to check for motion loss  -R shoulder: WNL with only end-range flexion and internal rotation deficit; intermittent pinch with shoulder complex abduction  -L shoulder: WNL Glenohumeral mobilization: R shoulder today, posterior and inferior glides gr III; 2 x 30-sec bouts per direction  ___________  Bilat hip PROM to check for motion loss, x 1 min on ea side Mulligan belt lateral distraction 3 x 30-sec bouts, bilat MWM R hip; lateral distraction with hip flexion and ER as tolerated    Therapeutic Exercise - for improved soft tissue flexibility and extensibility as needed for ROM, improved strength as needed to improve performance of CKC activities/functional movements  Supine wand ER AAROM; x20 on R and L UE Pulleys, forward elevation; x 2 min ___________  Supine butterfly stretch; 2 x 30 sec    PATIENT EDUCATION: Discussed ongoing HEP and use of OTC medications as advised by MD/PA and Magnesium supplement. Discussed possible f/u with rheumatology if symptoms persist with conservative care.     PATIENT EDUCATION:  Education details: see above for patient education details Person educated: Patient Education method: Explanation, Demonstration, and Handouts Education comprehension: verbalized understanding and returned demonstration   HOME EXERCISE PROGRAM:  Access Code: [email or web address] URL: [email or web address] Date: 11/20/2023 Prepared by: Venetia Endo  Exercises - Supine Shoulder Flexion with Dowel  - 2 x daily - 7 x weekly - 2 sets - 10 reps - Supine Shoulder External Rotation with Dowel  - 2 x daily - 7 x weekly - 2 sets -  10 reps - Seated Scapular Retraction  - 2 x daily - 7 x weekly - 2 sets - 10 reps - 3sec hold  ASSESSMENT:  CLINICAL  IMPRESSION: Patient was doing very well at end of last week with mild symptoms; he reports virtually no pain on Saturday, but notably worsening symptoms Sunday afternoon. Pt reports no specific aggravating factors/activities; he did mow his lawn, but did not feel that this bothered him. We are able to attain [facility] bilat shoulder ROM with manual therapy today. Pt has marked R>L hip stiffness and antalgic gait that persists through session today. We discussed continued stretching program and activity modification prn. Pt has current deficits in: reaching, lifting, bed mobility, shoulder ROM, deltoid and posterior cuff strength, postural changes; LE deficits in hip ROM, hip flexor and gluteal strength, coxofemoral stiffness, anterior hip/groin pain, and gait changes. Pt will continue to benefit from skilled PT services to address deficits and improve function.   OBJECTIVE IMPAIRMENTS: Abnormal gait, decreased mobility, difficulty walking, decreased ROM, decreased strength, hypomobility, impaired flexibility, impaired UE functional use, postural dysfunction, and pain.   ACTIVITY LIMITATIONS: carrying, lifting, bending, stairs, transfers, bed mobility, sleeping, and reach over head  PARTICIPATION LIMITATIONS: shopping, community activity, occupation, yard work, and fishing/boating participation  PERSONAL FACTORS: Past/current experiences, Profession, and 3+ comorbidities: (depression/anxiety, HTN) are also affecting patient's functional outcome.   REHAB POTENTIAL: Good  CLINICAL DECISION MAKING: Unstable/unpredictable  EVALUATION COMPLEXITY: High   GOALS: Goals reviewed with patient? Yes  SHORT TERM GOALS: Target date: 12/12/2023  Pt will be independent with HEP to improve strength and decrease shoulder pain to improve pain-free function at home and work. Baseline: 11/20/23: Baseline HEP initiated for upper body; will further develop LE HEP at future f/u.  Goal status: INITIAL   LONG TERM GOALS:  Target date: 01/17/2024  Pt will have shoulder forward flexion to 160 deg or greater bilaterally and functional ER/IR WFL without reproduction of pain as needed for overhead activity and functional reaching tasks Baseline: 11/20/23: Motion loss in forward elevation and ER.  Goal status: INITIAL  2.  Pt will decrease worst shoulder pain by at least 3 points on the NPRS in order to demonstrate clinically significant reduction in shoulder pain. Baseline: 11/20/23: 10/10 at worst.  Goal status: INITIAL  3.  Pt will decrease quick DASH score to no more than 20% in order to demonstrate clinically significant reduction in disability related to shoulder pain       Baseline: 11/20/23: 75% Goal status: INITIAL  4. Pt will increase strength of deltoid and posterior cuff mm to at least 4+/5 or greater MMT grade in order to demonstrate improvement in strength and function Baseline: 11/20/23: Shoulder flexion and ER 4-/5 with pain reproduction.  Goal status: INITIAL  5. Pt will improve strength of flexors, abductors, extensors to 4+/5 or greater MMT grade indicative of improved proximal/hip strength as needed for improved tolerance of load-bearing activities and transferring Baseline: 11/22/23: Shoulder flexion and ER 4-/5 with pain reproduction.  Goal status: INITIAL  6. Pt will have pain-free sit to stand and stand-to-sit transfer without reproduction of pain as needed for household and community mobility and getting up/down from office chair during workday.  Baseline: 11/22/23: Notable pain behaviors with transfers. Goal status: INITIAL  PLAN: PT FREQUENCY: 1-2x/week  PT DURATION: 8 weeks  PLANNED INTERVENTIONS: Therapeutic exercises, Therapeutic activity, Neuromuscular re-education, Balance training, Gait training, Patient/Family education, Self Care, Joint mobilization, Joint manipulation, Vestibular training, Canalith repositioning, Orthotic/Fit training, DME instructions, Dry  Needling, Electrical  stimulation, Spinal manipulation, Spinal mobilization, Cryotherapy, Moist heat, Taping, Traction, Ultrasound, Ionotophoresis 4mg /ml Dexamethasone, Manual therapy, and Re-evaluation.  PLAN FOR NEXT SESSION: Continue with manual techniques for GJH and scapular mobility. AAROM drills and posterior cuff and periscapular isotonics as tolerated. Serratus activation and scapular stability. Hip flexor and adductor stretching; gluteal strengthening with low-impact drills initially. Manual techniques/Mulligan mobilizations for hips prn.    Venetia Endo, PT, DPT #[id number]  Venetia ONEIDA Endo, PT 11/27/2023, 9:47 AM

## 2023-11-29 ENCOUNTER — Ambulatory Visit: Admitting: Physical Therapy

## 2023-11-29 ENCOUNTER — Encounter: Payer: Self-pay | Admitting: Physical Therapy

## 2023-11-29 DIAGNOSIS — R262 Difficulty in walking, not elsewhere classified: Secondary | ICD-10-CM

## 2023-11-29 DIAGNOSIS — M25511 Pain in right shoulder: Secondary | ICD-10-CM | POA: Diagnosis not present

## 2023-11-29 DIAGNOSIS — M6281 Muscle weakness (generalized): Secondary | ICD-10-CM

## 2023-11-29 DIAGNOSIS — M25551 Pain in right hip: Secondary | ICD-10-CM

## 2023-11-29 NOTE — Therapy (Unsigned)
 OUTPATIENT PHYSICAL THERAPY TREATMENT  Patient Name: Javier BARBATO Sr. MRN: [id number] DOB:1960-12-31, 63 y.o., male Today's Date: 11/29/2023   END OF SESSION:  PT End of Session - 11/29/23 0856     Visit Number 4    Number of Visits 17    Date for PT Re-Evaluation 01/15/24    Authorization Type [employer] 2025: VL 60 combined PT/OT/Speech    PT Start Time 0858    PT Stop Time 0941    PT Time Calculation (min) 43 min    Activity Tolerance Patient limited by pain    Behavior During Therapy [facility] for tasks assessed/performed             Past Medical History:  Diagnosis Date   Allergy 1960   Anxiety 1960   Asthma 1970   Basal cell carcinoma 03/07/2022   Right inf cheek - [facility]   Dysplastic nevus 02/13/2019   Left low back, 11cm lat. to spine. Mild atypia, peripheral and deep margins involved.    GERD (gastroesophageal reflux disease) 1990   Hypertension 1980   Inactive TB 1999   Sarcoidosis 1997-2000   Sleep apnea 1990   Tuberculosis 1990   Inactive.   Past Surgical History:  Procedure Laterality Date   APPENDECTOMY  1993   COLONOSCOPY WITH PROPOFOL  N/A 04/01/2015   Procedure: COLONOSCOPY WITH PROPOFOL ;  Surgeon: Lamar ONEIDA Holmes, MD;  Location: [facility] ENDOSCOPY;  Service: Endoscopy;  Laterality: N/A;   EYE SURGERY  2010   Lasix   NASAL SEPTUM SURGERY  04/18/2007   pillar procedure  04/18/2007   TONSILLECTOMY     Patient Active Problem List   Diagnosis Date Noted   Generalized osteoarthritis of multiple sites 10/29/2023   History of adenomatous polyp of colon 08/13/2023   Restless legs 08/13/2023   Lymphedema 05/05/2023   Mild asthma 10/11/2020   History of iron deficiency 10/11/2020   GERD (gastroesophageal reflux disease) 07/13/2020   Benign essential HTN 07/13/2020   Sarcoidosis 07/13/2020   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   OSA on CPAP 07/13/2020   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/2020   IBS (irritable bowel syndrome) 07/13/2020   Hypogonadism in male 07/13/2020    ED (erectile dysfunction) 07/13/2020   Depression with anxiety 07/13/2020   Chronic fatigue 07/13/2020   Decreased testosterone level 05/19/2020    PCP: Toribio SHAUNNA Hoyle, PA  REFERRING PROVIDER: Toribio SHAUNNA Hoyle, PA  REFERRING DIAG: M15.9 (ICD-10-CM) - Generalized osteoarthritis of multiple sites   RATIONALE FOR EVALUATION AND TREATMENT: Rehabilitation  THERAPY DIAG: Bilateral shoulder pain, unspecified chronicity  Bilateral hip pain  Muscle weakness (generalized)  Difficulty in walking, not elsewhere classified  ONSET DATE: 3 months ago  FOLLOW-UP APPT SCHEDULED WITH REFERRING PROVIDER: None for this condition scheduled soon   PERTINENT HISTORY: Pt is a 63 year old male referred from PCP for osteoarthritis of multiple joints; he has Hx of groin pain beginning on R side than hurting L. Then, he had R shoulder pain with eventual progression to L shoulder pain. Pt reports primarily L>R shoulder pain this AM. No numbness/tingling.   Pain began with groin pain with each step. He had R shoulder pain starting after that. He had prednisone  taper that made it better. Patient reports that pain progressed into involving bilateral shoulders and bilateral groin region. Pt reports this past Friday, he started having pain along base of his neck and was having severe pain this past weekend. He reports more popping in his R shoulder over this past week. Pt reports taking magnesium  seems to have helped; condition is better with taking this and resting. Pt reports sarcoidosis diagnosis 25 years ago that led to multi-site joint pain. Pt reports 20 years since significant flare-up of this condition. 50 out of 10 pain when it's flared up.   Pt reports notable pain with attempting to lift his R arm.   PAIN:  Pain Intensity: Present: 3/10, Best: 0-1/10 (on prednisone ), Worst: 10/10 Pain location: L deltoid > R deltoid/ACJ region; R and L groin/medial thigh pain Pain Quality: aching , intermittent  sharp pain with specific motion  Radiating: No  Numbness/Tingling: No N//T Focal Weakness: No Aggravating factors: transferring from bed/chair/couch, rolling in bed, walking, sitting Relieving factors: prednisone , magnesium supplement 24-hour pain behavior: No specific time of day  History of prior shoulder or neck/shoulder injury, pain, surgery, or therapy: No Falls: Has patient fallen in last 6 months? No, Number of falls: N/A Dominant hand: right  Imaging: Yes ;  CLINICAL DATA:  atraumatic right shoulder pain x3wk  EXAM: RIGHT SHOULDER - 2+ VIEW  COMPARISON:  None Available.  FINDINGS: No acute fracture or dislocation. Moderate AC joint space loss. Soft tissues are unremarkable. ...     Narrative & Impression  CLINICAL DATA:  atraumatic right hip/groin pain x3 weeks, painful gait   EXAM: DG HIP (WITH OR WITHOUT PELVIS) 2-3V RIGHT   COMPARISON:  None Available.   FINDINGS: There is no evidence of hip fracture or dislocation of the right hip. No acute displaced fracture left hip on frontal view. No acute displaced fracture or diastasis of the bones of the pelvis. There is no evidence of arthropathy or other focal bone abnormality.   Right lower lobe vascular clips and likely surgical staples.   IMPRESSION: Negative for acute traumatic injury.    Red flags (personal history of cancer, chills/fever, night sweats, nausea, vomiting, unrelenting pain, unexplained weight gain/loss): Positive for skin cancer; removed several months ago   PRECAUTIONS: None  WEIGHT BEARING RESTRICTIONS: No  FALLS: Has patient fallen in last 6 months? 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)  Hobbies: fishing, boating   Patient Goals: Reduce pain, able to go fishing     OBJECTIVE (data from initial evaluation unless otherwise dated):   Patient Surveys  QuickDASH: 75%  Posture Rounded shoulders, inc thoracic kyphosis, forward head  Gait Shortened unipedal stance time bilat, forward flexed posture worse with first few steps  Cervical Screen AROM: see below Spurlings A (ipsilateral lateral flexion/axial compression): R: Negative (pain L UT with R lateral flexion) L: Negative Distraction Test: Negative Hoffman Sign (cervical cord compression): R: Negative L: Negative ULTTs deferred  AROM AROM (Normal range in degrees) AROM 11/20/23  Cervical  Flexion (50) WNL  Extension (80) 30* (pain low back with extension)  Right lateral flexion (45) WNL  Left lateral flexion (45) WNL* (ipsilateral pain affecting medial L UT)   Right rotation (85) WNL  Left rotation (85) WNL* (pain L UT)   Right Left  Shoulder    Flexion 145 158  Extension    Abduction 170 172  External Rotation (arm at side) 56 60  Internal Rotation [facility] WFL  Hands Behind Head    Hands Behind Back        Elbow    Flexion WNL WNL  Extension WNL WNL  Pronation    Supination    (* = pain; Blank rows = not tested)  UE MMT: MMT (out of 5) Right 11/20/23 Left 11/20/23      Shoulder   Flexion 4-* 4-*  Extension    Abduction 4+ 5-  External rotation 4-* 4-  Internal rotation 5 5  Horizontal abduction    Horizontal adduction    Lower Trapezius    Rhomboids        Elbow  Flexion 5 5  Extension 5 5  Pronation    Supination        (* = pain; Blank rows = not tested)  Sensation Negative for sensory deficit UE/LE   Reflexes Deferred  Palpation Location LEFT  RIGHT           Subocciptials    Cervical paraspinals    Upper Trapezius 0 0  Levator Scapulae    Rhomboid  Major/Minor    Sternoclavicular joint    Acromioclavicular joint 0 0  Coracoid process 0 0  Long head of biceps 0 0  Supraspinatus 0 0  Infraspinatus 0 0  Subscapularis    Teres Minor 0 0  Teres Major    Pectoralis Major 0 0  Pectoralis Minor    Anterior Deltoid 0 0  Lateral Deltoid 0 0  Posterior Deltoid 0 0  Latissimus Dorsi    Sternocleidomastoid    (Blank rows = not tested) Graded on 0-4 scale (0 = no pain, 1 = pain, 2 = pain with wincing/grimacing/flinching, 3 = pain with withdrawal, 4 = unwilling to allow palpation), (Blank rows = not tested)   SPECIAL TESTS Rotator Cuff  Drop Arm Test: Negative Painful Arc (Pain from 60 to 120 degrees scaption): Negative Infraspinatus Muscle Test: Positive If all 3 tests positive, the probability of a full-thickness rotator cuff tear is 91%  Subacromial Impingement Hawkins-Kennedy: Not examined Neer (Block scapula, PROM flexion): Negative Painful Arc (Pain from 60 to 120 degrees scaption): Negative Empty Can: Negative External Rotation Resistance: Positive Horizontal Adduction: Not examined Scapular Assist: Positive (11/22/23)    Bicep Tendon Pathology Speed (shoulder flexion to 90, external rotation, full elbow extension, and forearm supination with resistance: Positive Yergason's (resisted shoulder ER and supination/biceps tendon pathology): R Negative, L Positive (mild pain)   ____________________________________   LOWER QUARTER EXAM 11/22/23   AROM AROM (Normal range in degrees) AROM  11/22/23  Lumbar   Flexion (65) WNL  Extension (30) WNL  Right lateral flexion (25) WNL  Left lateral flexion (25) WNL  Right rotation (30) 75%* (mild R groin)  Left rotation (30) 75%* (mild R groin)      Hip Right Left  Flexion (125) 120* 120*  Extension (15)    Abduction (40) 40* 40  Adduction     Internal Rotation (45) 15 15  External Rotation (45) 40* 40*      (* = pain; Blank rows = not tested)  LE MMT: MMT (out of 5)  Right 11/22/23 Left 11/22/23  Hip flexion 4* 4*  Hip extension 4* 4*  Hip abduction 4-* 4-*  Hip adduction    Hip internal rotation    Hip external rotation    Knee flexion 5 5  Knee extension 5 5  Ankle dorsiflexion    Ankle plantarflexion  Ankle inversion    Ankle eversion    (* = pain; Blank rows = not tested)  Sensation Deferred  Reflexes Deferred  Muscle Length Hamstrings: R: Negative L: Negative Ely (quadriceps): R: Positive L: Positive (no back pain)   Palpation Location Right Left         Lumbar paraspinals    Quadratus Lumborum    Gluteus Maximus 0 0  Gluteus Medius 0 0  Deep hip external rotators    PSIS    Fortin's Area (SIJ)    Greater Trochanter 0 0  Distal iliopsoas 1 1  (Blank rows = not tested) Graded on 0-4 scale (0 = no pain, 1 = pain, 2 = pain with wincing/grimacing/flinching, 3 = pain with withdrawal, 4 = unwilling to allow palpation)  Passive Accessory Intervertebral Motion Deferred  Special Tests Lumbar Radiculopathy and Discogenic: Slump (SN 83, -LR 0.32): R: Not examined L: Not examined SLR (SN 92, -LR 0.29): R: Negative L:  Negative Crossed SLR (SP 90): R: Negative L: Negative Prone Knee Bend: R: Negative L: Negative  Facet Joint: Extension-Rotation (SN 100, -LR 0.0): R: Negative L: Negative  Lumbar Foraminal Stenosis: Lumbar quadrant (SN 70): R: Negative L: Negative  Hip: FABER (SN 81): R: Positive L: Positive FADIR (SN 94): R: Not examined L: Not examined Hip scour (SN 50): R: Positive L: Positive  Piriformis Syndrome: PACE sign: Negative     TODAY'S TREATMENT   11/29/2023   SUBJECTIVE STATEMENT:   Patient reports 1/10 pain at arrival. He reports some pain along R posterolateral glute and bilat shoulders (similar on either side). Pt is doing well with reaching overhead at arrival. Patient reports doing well with HEP.     Manual Therapy - for symptom modulation, soft tissue sensitivity and mobility, joint mobility, ROM    R and L shoulder shoulder glenohumeral distraction, Kaltenborn gr III; 2 x 30 sec bouts R and L shoulder PROM to check for motion loss  -R shoulder: WNL with only mild discomfort with end-range internal rotation deficit  -L shoulder: WNL Glenohumeral mobilization: R and L shoulder today, posterior  glides gr III; 2 x 30-sec bouts   ___________  Bilat hip PROM to check for motion loss, x 1 min on ea side Mulligan belt lateral distraction 2 x 30-sec bouts, R side today  MWM R hip; lateral distraction with hip flexion and ER as tolerated    Therapeutic Exercise - for improved soft tissue flexibility and extensibility as needed for ROM, improved strength as needed to improve performance of CKC activities/functional movements  Serratus slide with Red band and foam roll; 2 x 8 Bilat ER with scap retraction, back to wall; Green Tband; 2 x 10 ___________  Supine butterfly stretch; 2 x 30 sec  Bridge; 2 x 10 Sidelying hip abduction; 2 x 10  PATIENT EDUCATION: Discussed ongoing HEP and use of OTC medications as advised by MD/PA and Magnesium supplement. Discussed possible f/u with rheumatology if symptoms persist with conservative care.    *not today* Supine wand ER AAROM; x20 on R and L UE Pulleys, forward elevation; x 2 min   PATIENT EDUCATION:  Education details: see above for patient education details Person educated: Patient Education method: Explanation, Demonstration, and Handouts Education comprehension: verbalized understanding and returned demonstration   HOME EXERCISE PROGRAM:  Access Code: [email or web address] URL: [email or web address] Date: 11/20/2023 Prepared by: Venetia Endo  Exercises - Supine Shoulder Flexion with Dowel  - 2 x daily - 7 x weekly - 2 sets -  10 reps - Supine Shoulder External Rotation with Dowel  - 2 x daily - 7 x weekly - 2 sets - 10 reps - Seated Scapular Retraction  - 2 x daily - 7 x weekly - 2 sets - 10 reps - 3sec  hold  ASSESSMENT:  CLINICAL IMPRESSION: Patient was doing very well at end of last week with mild symptoms; he reports virtually no pain on Saturday, but notably worsening symptoms Sunday afternoon. Pt reports no specific aggravating factors/activities; he did mow his lawn, but did not feel that this bothered him. We are able to attain [facility] bilat shoulder ROM with manual therapy today. Pt has marked R>L hip stiffness and antalgic gait that persists through session today. We discussed continued stretching program and activity modification prn. Pt has current deficits in: reaching, lifting, bed mobility, shoulder ROM, deltoid and posterior cuff strength, postural changes; LE deficits in hip ROM, hip flexor and gluteal strength, coxofemoral stiffness, anterior hip/groin pain, and gait changes. Pt will continue to benefit from skilled PT services to address deficits and improve function.   OBJECTIVE IMPAIRMENTS: Abnormal gait, decreased mobility, difficulty walking, decreased ROM, decreased strength, hypomobility, impaired flexibility, impaired UE functional use, postural dysfunction, and pain.   ACTIVITY LIMITATIONS: carrying, lifting, bending, stairs, transfers, bed mobility, sleeping, and reach over head  PARTICIPATION LIMITATIONS: shopping, community activity, occupation, yard work, and fishing/boating participation  PERSONAL FACTORS: Past/current experiences, Profession, and 3+ comorbidities: (depression/anxiety, HTN) are also affecting patient's functional outcome.   REHAB POTENTIAL: Good  CLINICAL DECISION MAKING: Unstable/unpredictable  EVALUATION COMPLEXITY: High   GOALS: Goals reviewed with patient? Yes  SHORT TERM GOALS: Target date: 12/12/2023  Pt will be independent with HEP to improve strength and decrease shoulder pain to improve pain-free function at home and work. Baseline: 11/20/23: Baseline HEP initiated for upper body; will further develop LE HEP at future f/u.  Goal status:  INITIAL   LONG TERM GOALS: Target date: 01/17/2024  Pt will have shoulder forward flexion to 160 deg or greater bilaterally and functional ER/IR WFL without reproduction of pain as needed for overhead activity and functional reaching tasks Baseline: 11/20/23: Motion loss in forward elevation and ER.  Goal status: INITIAL  2.  Pt will decrease worst shoulder pain by at least 3 points on the NPRS in order to demonstrate clinically significant reduction in shoulder pain. Baseline: 11/20/23: 10/10 at worst.  Goal status: INITIAL  3.  Pt will decrease quick DASH score to no more than 20% in order to demonstrate clinically significant reduction in disability related to shoulder pain       Baseline: 11/20/23: 75% Goal status: INITIAL  4. Pt will increase strength of deltoid and posterior cuff mm to at least 4+/5 or greater MMT grade in order to demonstrate improvement in strength and function Baseline: 11/20/23: Shoulder flexion and ER 4-/5 with pain reproduction.  Goal status: INITIAL  5. Pt will improve strength of flexors, abductors, extensors to 4+/5 or greater MMT grade indicative of improved proximal/hip strength as needed for improved tolerance of load-bearing activities and transferring Baseline: 11/22/23: Shoulder flexion and ER 4-/5 with pain reproduction.  Goal status: INITIAL  6. Pt will have pain-free sit to stand and stand-to-sit transfer without reproduction of pain as needed for household and community mobility and getting up/down from office chair during workday.  Baseline: 11/22/23: Notable pain behaviors with transfers. Goal status: INITIAL  PLAN: PT FREQUENCY: 1-2x/week  PT DURATION: 8 weeks  PLANNED INTERVENTIONS: Therapeutic exercises, Therapeutic activity, Neuromuscular  re-education, Balance training, Gait training, Patient/Family education, Self Care, Joint mobilization, Joint manipulation, Vestibular training, Canalith repositioning, Orthotic/Fit training, DME instructions,  Dry Needling, Electrical stimulation, Spinal manipulation, Spinal mobilization, Cryotherapy, Moist heat, Taping, Traction, Ultrasound, Ionotophoresis 4mg /ml Dexamethasone, Manual therapy, and Re-evaluation.  PLAN FOR NEXT SESSION: Continue with manual techniques for GJH and scapular mobility. AAROM drills and posterior cuff and periscapular isotonics as tolerated. Serratus activation and scapular stability. Hip flexor and adductor stretching; gluteal strengthening with low-impact drills initially. Manual techniques/Mulligan mobilizations for hips prn.    Venetia Endo, PT, DPT #[id number]  Venetia ONEIDA Endo, PT 11/29/2023, 8:57 AM

## 2023-12-04 ENCOUNTER — Encounter: Payer: Self-pay | Admitting: Physical Therapy

## 2023-12-04 ENCOUNTER — Ambulatory Visit: Admitting: Physical Therapy

## 2023-12-04 DIAGNOSIS — M25511 Pain in right shoulder: Secondary | ICD-10-CM | POA: Diagnosis not present

## 2023-12-04 DIAGNOSIS — R262 Difficulty in walking, not elsewhere classified: Secondary | ICD-10-CM

## 2023-12-04 DIAGNOSIS — M6281 Muscle weakness (generalized): Secondary | ICD-10-CM

## 2023-12-04 DIAGNOSIS — M25552 Pain in left hip: Secondary | ICD-10-CM

## 2023-12-04 NOTE — Therapy (Signed)
 OUTPATIENT PHYSICAL THERAPY TREATMENT  Patient Name: Javier KATTNER Sr. MRN: [id number] DOB:[birth year], 63 y.o., male Today's Date: 12/04/2023  END OF SESSION:  PT End of Session - 12/04/23 0859     Visit Number 5    Number of Visits 17    Date for PT Re-Evaluation 01/15/24    Authorization Type [employer] 2025: VL 60 combined PT/OT/Speech    PT Start Time 0859    PT Stop Time 0945    PT Time Calculation (min) 46 min    Activity Tolerance Patient limited by pain    Behavior During Therapy [facility] for tasks assessed/performed         Past Medical History:  Diagnosis Date   Allergy 1960   Anxiety 1960   Asthma 1970   Basal cell carcinoma 03/07/2022   Right inf cheek - [facility]   Dysplastic nevus 02/13/2019   Left low back, 11cm lat. to spine. Mild atypia, peripheral and deep margins involved.    GERD (gastroesophageal reflux disease) 1990   Hypertension 1980   Inactive TB 1999   Sarcoidosis 1997-2000   Sleep apnea 1990   Tuberculosis 1990   Inactive.   Past Surgical History:  Procedure Laterality Date   APPENDECTOMY  1993   COLONOSCOPY WITH PROPOFOL  N/A 04/01/2015   Procedure: COLONOSCOPY WITH PROPOFOL ;  Surgeon: Lamar ONEIDA Holmes, MD;  Location: [facility] ENDOSCOPY;  Service: Endoscopy;  Laterality: N/A;   EYE SURGERY  2010   Lasix   NASAL SEPTUM SURGERY  04/18/2007   pillar procedure  04/18/2007   TONSILLECTOMY     Patient Active Problem List   Diagnosis Date Noted   Generalized osteoarthritis of multiple sites 10/29/2023   History of adenomatous polyp of colon 08/13/2023   Restless legs 08/13/2023   Lymphedema 05/05/2023   Mild asthma 10/11/2020   History of iron deficiency 10/11/2020   GERD (gastroesophageal reflux disease) 07/13/2020   Benign essential HTN 07/13/2020   Sarcoidosis 07/13/2020   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   OSA on CPAP 07/13/2020   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/2020   IBS (irritable bowel syndrome) 07/13/2020   Hypogonadism in male 07/13/2020   ED  (erectile dysfunction) 07/13/2020   Depression with anxiety 07/13/2020   Chronic fatigue 07/13/2020   Decreased testosterone level 05/19/2020    PCP: Toribio SHAUNNA Hoyle, PA  REFERRING PROVIDER: Toribio SHAUNNA Hoyle, PA  REFERRING DIAG: M15.9 (ICD-10-CM) - Generalized osteoarthritis of multiple sites   RATIONALE FOR EVALUATION AND TREATMENT: Rehabilitation  THERAPY DIAG: Bilateral shoulder pain, unspecified chronicity  Bilateral hip pain  Muscle weakness (generalized)  Difficulty in walking, not elsewhere classified  ONSET DATE: 3 months ago  FOLLOW-UP APPT SCHEDULED WITH REFERRING PROVIDER: None for this condition scheduled soon   PERTINENT HISTORY: Pt is a 63 year old male referred from PCP for osteoarthritis of multiple joints; he has Hx of groin pain beginning on R side than hurting L. Then, he had R shoulder pain with eventual progression to L shoulder pain. Pt reports primarily L>R shoulder pain this AM. No numbness/tingling.   Pain began with groin pain with each step. He had R shoulder pain starting after that. He had prednisone  taper that made it better. Patient reports that pain progressed into involving bilateral shoulders and bilateral groin region. Pt reports this past Friday, he started having pain along base of his neck and was having severe pain this past weekend. He reports more popping in his R shoulder over this past week. Pt reports taking magnesium seems to have helped; condition  is better with taking this and resting. Pt reports sarcoidosis diagnosis 25 years ago that led to multi-site joint pain. Pt reports 20 years since significant flare-up of this condition. 50 out of 10 pain when it's flared up.   Pt reports notable pain with attempting to lift his R arm.   PAIN:  Pain Intensity: Present: 3/10, Best: 0-1/10 (on prednisone ), Worst: 10/10 Pain location: L deltoid > R deltoid/ACJ region; R and L groin/medial thigh pain Pain Quality: aching , intermittent sharp  pain with specific motion  Radiating: No  Numbness/Tingling: No N//T Focal Weakness: No Aggravating factors: transferring from bed/chair/couch, rolling in bed, walking, sitting Relieving factors: prednisone , magnesium supplement 24-hour pain behavior: No specific time of day  History of prior shoulder or neck/shoulder injury, pain, surgery, or therapy: No Falls: Has patient fallen in last 6 months? No, Number of falls: N/A Dominant hand: right  Imaging: Yes ;  CLINICAL DATA:  atraumatic right shoulder pain x3wk  EXAM: RIGHT SHOULDER - 2+ VIEW  COMPARISON:  None Available.  FINDINGS: No acute fracture or dislocation. Moderate AC joint space loss. Soft tissues are unremarkable. ...     Narrative & Impression  CLINICAL DATA:  atraumatic right hip/groin pain x3 weeks, painful gait   EXAM: DG HIP (WITH OR WITHOUT PELVIS) 2-3V RIGHT   COMPARISON:  None Available.   FINDINGS: There is no evidence of hip fracture or dislocation of the right hip. No acute displaced fracture left hip on frontal view. No acute displaced fracture or diastasis of the bones of the pelvis. There is no evidence of arthropathy or other focal bone abnormality.   Right lower lobe vascular clips and likely surgical staples.   IMPRESSION: Negative for acute traumatic injury.    Red flags (personal history of cancer, chills/fever, night sweats, nausea, vomiting, unrelenting pain, unexplained weight gain/loss): Positive for skin cancer; removed several months ago   PRECAUTIONS: None  WEIGHT BEARING RESTRICTIONS: No  FALLS: Has patient fallen in last 6 months? 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)  Hobbies: fishing, boating   Patient Goals: Reduce pain, able to go fishing     OBJECTIVE (data from initial evaluation unless otherwise dated):   Patient Surveys  QuickDASH: 75%  Posture Rounded shoulders, inc thoracic kyphosis, forward head  Gait Shortened unipedal stance time bilat, forward flexed posture worse with first few steps  Cervical Screen AROM: see below Spurlings A (ipsilateral lateral flexion/axial compression): R: Negative (pain L UT with R lateral flexion) L: Negative Distraction Test: Negative Hoffman Sign (cervical cord compression): R: Negative L: Negative ULTTs deferred  AROM AROM (Normal range in degrees) AROM 11/20/23  Cervical  Flexion (50) WNL  Extension (80) 30* (pain low back with extension)  Right lateral flexion (45) WNL  Left lateral flexion (45) WNL* (ipsilateral pain affecting medial L UT)   Right rotation (85) WNL  Left rotation (85) WNL* (pain L UT)   Right Left  Shoulder    Flexion 145 158  Extension    Abduction 170 172  External Rotation (arm at side) 56 60  Internal Rotation [facility]  Hands Behind Head    Hands Behind Back        Elbow    Flexion WNL WNL  Extension WNL WNL  Pronation    Supination    (* = pain; Blank rows = not tested)  UE MMT: MMT (out of 5) Right 11/20/23 Left 11/20/23      Shoulder   Flexion 4-* 4-*  Extension    Abduction 4+ 5-  External rotation 4-* 4-  Internal rotation 5 5  Horizontal abduction    Horizontal adduction    Lower Trapezius    Rhomboids        Elbow  Flexion 5 5  Extension 5 5  Pronation    Supination        (* = pain; Blank rows = not tested)  Sensation Negative for sensory deficit UE/LE   Reflexes Deferred  Palpation Location LEFT  RIGHT           Subocciptials    Cervical paraspinals    Upper Trapezius 0 0  Levator Scapulae    Rhomboid  Major/Minor    Sternoclavicular joint    Acromioclavicular joint 0 0  Coracoid process 0 0  Long head of biceps 0 0  Supraspinatus 0 0  Infraspinatus 0 0  Subscapularis    Teres Minor 0 0  Teres Major    Pectoralis Major 0 0  Pectoralis Minor    Anterior Deltoid 0 0  Lateral Deltoid 0 0  Posterior Deltoid 0 0  Latissimus Dorsi    Sternocleidomastoid    (Blank rows = not tested) Graded on 0-4 scale (0 = no pain, 1 = pain, 2 = pain with wincing/grimacing/flinching, 3 = pain with withdrawal, 4 = unwilling to allow palpation), (Blank rows = not tested)   SPECIAL TESTS Rotator Cuff  Drop Arm Test: Negative Painful Arc (Pain from 60 to 120 degrees scaption): Negative Infraspinatus Muscle Test: Positive If all 3 tests positive, the probability of a full-thickness rotator cuff tear is 91%  Subacromial Impingement Hawkins-Kennedy: Not examined Neer (Block scapula, PROM flexion): Negative Painful Arc (Pain from 60 to 120 degrees scaption): Negative Empty Can: Negative External Rotation Resistance: Positive Horizontal Adduction: Not examined Scapular Assist: Positive (11/22/23)    Bicep Tendon Pathology Speed (shoulder flexion to 90, external rotation, full elbow extension, and forearm supination with resistance: Positive Yergason's (resisted shoulder ER and supination/biceps tendon pathology): R Negative, L Positive (mild pain)  ___________________________________  LOWER QUARTER EXAM 11/22/23   AROM AROM (Normal range in degrees) AROM  11/22/23  Lumbar   Flexion (65) WNL  Extension (30) WNL  Right lateral flexion (25) WNL  Left lateral flexion (25) WNL  Right rotation (30) 75%* (mild R groin)  Left rotation (30) 75%* (mild R groin)      Hip Right Left  Flexion (125) 120* 120*  Extension (15)    Abduction (40) 40* 40  Adduction     Internal Rotation (45) 15 15  External Rotation (45) 40* 40*      (* = pain; Blank rows = not tested)  LE MMT: MMT (out of 5)  Right 11/22/23 Left 11/22/23  Hip flexion 4* 4*  Hip extension 4* 4*  Hip abduction 4-* 4-*  Hip adduction    Hip internal rotation    Hip external rotation    Knee flexion 5 5  Knee extension 5 5  Ankle dorsiflexion    Ankle plantarflexion    Ankle inversion    Ankle eversion    (* =  pain; Blank rows = not tested)  Sensation Deferred  Reflexes Deferred  Muscle Length Hamstrings: R: Negative L: Negative Ely (quadriceps): R: Positive L: Positive (no back pain)   Palpation Location Right Left         Lumbar paraspinals    Quadratus Lumborum    Gluteus Maximus 0 0  Gluteus Medius 0 0  Deep hip external rotators    PSIS    Fortin's Area (SIJ)    Greater Trochanter 0 0  Distal iliopsoas 1 1  (Blank rows = not tested) Graded on 0-4 scale (0 = no pain, 1 = pain, 2 = pain with wincing/grimacing/flinching, 3 = pain with withdrawal, 4 = unwilling to allow palpation)  Passive Accessory Intervertebral Motion Deferred  Special Tests Lumbar Radiculopathy and Discogenic: Slump (SN 83, -LR 0.32): R: Not examined L: Not examined SLR (SN 92, -LR 0.29): R: Negative L:  Negative Crossed SLR (SP 90): R: Negative L: Negative Prone Knee Bend: R: Negative L: Negative  Facet Joint: Extension-Rotation (SN 100, -LR 0.0): R: Negative L: Negative  Lumbar Foraminal Stenosis: Lumbar quadrant (SN 70): R: Negative L: Negative  Hip: FABER (SN 81): R: Positive L: Positive FADIR (SN 94): R: Not examined L: Not examined Hip scour (SN 50): R: Positive L: Positive  Piriformis Syndrome: PACE sign: Negative     TODAY'S TREATMENT   12/04/2023   SUBJECTIVE STATEMENT:   Patient reports 10/10 bilateral shoulder pain and 8/10 right hip pain which radiates into his groin region at arrival. He reports a gradual pain in bilateral hip and shoulder joints that started to flare up on Sunday afternoon which he attributes to Sarcoidosis. The pain has made sleeping or actively moving his shoulders/LEs  extremely difficult yesterday and this morning.  Pt. Planning to reach out to Toribio Hoyle, PA-C.     Manual Therapy - for symptom modulation, soft tissue sensitivity and mobility, joint mobility, ROM   Supine:  R and L shoulder shoulder glenohumeral distraction, grade III-IV R and L shoulder PROM: flexion, abduction, ER, IR  -R shoulder significant deficits with external rotation, significant pain   -L shoulder: significant muscle guarding and pain Glenohumeral mobilization: R and L shoulder today, posterior and inferior glides grade I  Bilat hip PROM: flexion, IR, ER - L hip: significant pain in flexion - R hip: significant pain all directions, significant limitations with ER  Not Performed Today:  Mulligan belt lateral distraction 2 x 30-sec bouts, R side today  MWM R hip; lateral distraction with hip flexion and ER as tolerated  Therapeutic Exercise - for improved soft tissue flexibility and extensibility as needed for ROM, improved strength as needed to improve performance of CKC activities/functional movements  Supine Shoulder Flexion with Dowel, 1x8 Supine Shoulder External Rotation with Dowel, 1x8 bilaterally  PATIENT EDUCATION: Discussed continued HEP and PT POC.    NOT TODAY Serratus slide with Red band and foam roll; 2 x 8 Bilat ER with scap retraction, back to wall; Green Tband; 2 x 10 ___________  Supine butterfly stretch; 2 x 30 sec  Bridge; 2 x 10 Sidelying hip abduction; 2 x 10 Supine wand ER AAROM; x20 on R and L UE Pulleys, forward elevation; x 2 min   PATIENT EDUCATION:  Education details: see above for patient education details Person educated: Patient Education method: Explanation, Demonstration, and Handouts Education comprehension: verbalized understanding and returned demonstration   HOME EXERCISE PROGRAM:  Access Code: [email or web address] URL: [email or web address] Date: 11/20/2023 Prepared by: Venetia Endo  Exercises  - Supine  Shoulder Flexion with Dowel  - 2 x daily - 7 x weekly - 2 sets - 10 reps - Supine Shoulder External Rotation with Dowel  - 2 x daily - 7 x weekly - 2 sets - 10 reps - Seated Scapular Retraction  - 2 x daily - 7 x weekly - 2 sets - 10 reps - 3sec hold  ASSESSMENT:  CLINICAL IMPRESSION: Pt presents to physical therapy with significant pain in bilateral shoulders and hips.  Focus of today's session was manual therapy with grade I bilateral posterior and inferior shoulder mobilizations for pain management.  Pt. With significant muscle guarding and pain during PROM of bilateral shoulders and hips in all directions and significant limitations in ER for all joints. Pt. observed to be ambulating into clinic with antalgic gait, decreased bilateral arm swing, and decreased gait speed.  Pt. also observed to have difficulty and an increase in shoulder pain with bed mobility.  Pt. Able to tolerated supine AAROM with dowel into flexion and bilateral ER with improvements in ROM with each rep.  Pt. Did report ease of symptoms at end of session but pain levels remained extremely high (unable to quantify). Pt. will continue to benefit from skilled PT services to address deficits and improve function.   OBJECTIVE IMPAIRMENTS: Abnormal gait, decreased mobility, difficulty walking, decreased ROM, decreased strength, hypomobility, impaired flexibility, impaired UE functional use, postural dysfunction, and pain.   ACTIVITY LIMITATIONS: carrying, lifting, bending, stairs, transfers, bed mobility, sleeping, and reach over head  PARTICIPATION LIMITATIONS: shopping, community activity, occupation, yard work, and fishing/boating participation  PERSONAL FACTORS: Past/current experiences, Profession, and 3+ comorbidities: (depression/anxiety, HTN) are also affecting patient's functional outcome.   REHAB POTENTIAL: Good  CLINICAL DECISION MAKING: Unstable/unpredictable  EVALUATION COMPLEXITY: High   GOALS: Goals reviewed  with patient? Yes  SHORT TERM GOALS: Target date: 12/12/2023  Pt will be independent with HEP to improve strength and decrease shoulder pain to improve pain-free function at home and work. Baseline: 11/20/23: Baseline HEP initiated for upper body; will further develop LE HEP at future f/u.  Goal status: INITIAL   LONG TERM GOALS: Target date: 01/17/2024  Pt will have shoulder forward flexion to 160 deg or greater bilaterally and functional ER/IR WFL without reproduction of pain as needed for overhead activity and functional reaching tasks Baseline: 11/20/23: Motion loss in forward elevation and ER.  Goal status: INITIAL  2.  Pt will decrease worst shoulder pain by at least 3 points on the NPRS in order to demonstrate clinically significant reduction in shoulder pain. Baseline: 11/20/23: 10/10 at worst.  Goal status: INITIAL  3.  Pt will decrease quick DASH score to no more than 20% in order to demonstrate clinically significant reduction in disability related to shoulder pain       Baseline: 11/20/23: 75% Goal status: INITIAL  4. Pt will increase strength of deltoid and posterior cuff mm to at least 4+/5 or greater MMT grade in order to demonstrate improvement in strength and function Baseline: 11/20/23: Shoulder flexion and ER 4-/5 with pain reproduction.  Goal status: INITIAL  5. Pt will improve strength of flexors, abductors, extensors to 4+/5 or greater MMT grade indicative of improved proximal/hip strength as needed for improved tolerance of load-bearing activities and transferring Baseline: 11/22/23: Shoulder flexion and ER 4-/5 with pain reproduction.  Goal status: INITIAL  6. Pt will have pain-free sit to stand and stand-to-sit transfer without reproduction of pain as needed for household and community  mobility and getting up/down from office chair during workday.  Baseline: 11/22/23: Notable pain behaviors with transfers. Goal status: INITIAL  PLAN: PT FREQUENCY: 1-2x/week  PT  DURATION: 8 weeks  PLANNED INTERVENTIONS: Therapeutic exercises, Therapeutic activity, Neuromuscular re-education, Balance training, Gait training, Patient/Family education, Self Care, Joint mobilization, Joint manipulation, Vestibular training, Canalith repositioning, Orthotic/Fit training, DME instructions, Dry Needling, Electrical stimulation, Spinal manipulation, Spinal mobilization, Cryotherapy, Moist heat, Taping, Traction, Ultrasound, Ionotophoresis 4mg /ml Dexamethasone, Manual therapy, and Re-evaluation.  PLAN FOR NEXT SESSION: Continue with manual techniques for GJH and scapular mobility. AAROM and periscapular isotonics as tolerated if pain levels are still high. Manual techniques/Mulligan mobilizations for hips dependent on pain.    Javier Martin, PT, DPT # [id number] Curtistine Bracket, Student-PT 12/04/2023, 10:27 AM

## 2023-12-06 ENCOUNTER — Ambulatory Visit: Admitting: Physical Therapy

## 2023-12-06 ENCOUNTER — Encounter: Payer: Self-pay | Admitting: Physical Therapy

## 2023-12-06 DIAGNOSIS — M25551 Pain in right hip: Secondary | ICD-10-CM

## 2023-12-06 DIAGNOSIS — M6281 Muscle weakness (generalized): Secondary | ICD-10-CM

## 2023-12-06 DIAGNOSIS — R262 Difficulty in walking, not elsewhere classified: Secondary | ICD-10-CM

## 2023-12-06 DIAGNOSIS — M25511 Pain in right shoulder: Secondary | ICD-10-CM

## 2023-12-06 NOTE — Therapy (Signed)
 OUTPATIENT PHYSICAL THERAPY TREATMENT  Patient Name: Javier RISE Sr. MRN: [id number] DOB:1960-11-23, 63 y.o., male Today's Date: 12/06/2023  END OF SESSION:  PT End of Session - 12/06/23 0905     Visit Number 6    Number of Visits 17    Date for PT Re-Evaluation 01/15/24    Authorization Type [employer] 2025: VL 60 combined PT/OT/Speech    PT Start Time 0905    PT Stop Time 0947    PT Time Calculation (min) 42 min    Activity Tolerance Patient limited by pain    Behavior During Therapy [facility] for tasks assessed/performed         Past Medical History:  Diagnosis Date   Allergy 1960   Anxiety 1960   Asthma 1970   Basal cell carcinoma 03/07/2022   Right inf cheek - [facility]   Dysplastic nevus 02/13/2019   Left low back, 11cm lat. to spine. Mild atypia, peripheral and deep margins involved.    GERD (gastroesophageal reflux disease) 1990   Hypertension 1980   Inactive TB 1999   Sarcoidosis 1997-2000   Sleep apnea 1990   Tuberculosis 1990   Inactive.   Past Surgical History:  Procedure Laterality Date   APPENDECTOMY  1993   COLONOSCOPY WITH PROPOFOL  N/A 04/01/2015   Procedure: COLONOSCOPY WITH PROPOFOL ;  Surgeon: Lamar ONEIDA Holmes, MD;  Location: [facility] ENDOSCOPY;  Service: Endoscopy;  Laterality: N/A;   EYE SURGERY  2010   Lasix   NASAL SEPTUM SURGERY  04/18/2007   pillar procedure  04/18/2007   TONSILLECTOMY     Patient Active Problem List   Diagnosis Date Noted   Generalized osteoarthritis of multiple sites 10/29/2023   History of adenomatous polyp of colon 08/13/2023   Restless legs 08/13/2023   Lymphedema 05/05/2023   Mild asthma 10/11/2020   History of iron deficiency 10/11/2020   GERD (gastroesophageal reflux disease) 07/13/2020   Benign essential HTN 07/13/2020   Sarcoidosis 07/13/2020   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   OSA on CPAP 07/13/2020   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/2020   IBS (irritable bowel syndrome) 07/13/2020   Hypogonadism in male 07/13/2020   ED  (erectile dysfunction) 07/13/2020   Depression with anxiety 07/13/2020   Chronic fatigue 07/13/2020   Decreased testosterone level 05/19/2020    PCP: Toribio SHAUNNA Hoyle, PA  REFERRING PROVIDER: Toribio SHAUNNA Hoyle, PA  REFERRING DIAG: M15.9 (ICD-10-CM) - Generalized osteoarthritis of multiple sites   RATIONALE FOR EVALUATION AND TREATMENT: Rehabilitation  THERAPY DIAG: Bilateral shoulder pain, unspecified chronicity  Bilateral hip pain  Muscle weakness (generalized)  Difficulty in walking, not elsewhere classified  ONSET DATE: 3 months ago  FOLLOW-UP APPT SCHEDULED WITH REFERRING PROVIDER: None for this condition scheduled soon   PERTINENT HISTORY: Pt is a 63 year old male referred from PCP for osteoarthritis of multiple joints; he has Hx of groin pain beginning on R side than hurting L. Then, he had R shoulder pain with eventual progression to L shoulder pain. Pt reports primarily L>R shoulder pain this AM. No numbness/tingling.   Pain began with groin pain with each step. He had R shoulder pain starting after that. He had prednisone  taper that made it better. Patient reports that pain progressed into involving bilateral shoulders and bilateral groin region. Pt reports this past Friday, he started having pain along base of his neck and was having severe pain this past weekend. He reports more popping in his R shoulder over this past week. Pt reports taking magnesium seems to have helped; condition  is better with taking this and resting. Pt reports sarcoidosis diagnosis 25 years ago that led to multi-site joint pain. Pt reports 20 years since significant flare-up of this condition. 50 out of 10 pain when it's flared up.   Pt reports notable pain with attempting to lift his R arm.   PAIN:  Pain Intensity: Present: 3/10, Best: 0-1/10 (on prednisone ), Worst: 10/10 Pain location: L deltoid > R deltoid/ACJ region; R and L groin/medial thigh pain Pain Quality: aching , intermittent sharp  pain with specific motion  Radiating: No  Numbness/Tingling: No N//T Focal Weakness: No Aggravating factors: transferring from bed/chair/couch, rolling in bed, walking, sitting Relieving factors: prednisone , magnesium supplement 24-hour pain behavior: No specific time of day  History of prior shoulder or neck/shoulder injury, pain, surgery, or therapy: No Falls: Has patient fallen in last 6 months? No, Number of falls: N/A Dominant hand: right  Imaging: Yes ;  CLINICAL DATA:  atraumatic right shoulder pain x3wk  EXAM: RIGHT SHOULDER - 2+ VIEW  COMPARISON:  None Available.  FINDINGS: No acute fracture or dislocation. Moderate AC joint space loss. Soft tissues are unremarkable. ...     Narrative & Impression  CLINICAL DATA:  atraumatic right hip/groin pain x3 weeks, painful gait   EXAM: DG HIP (WITH OR WITHOUT PELVIS) 2-3V RIGHT   COMPARISON:  None Available.   FINDINGS: There is no evidence of hip fracture or dislocation of the right hip. No acute displaced fracture left hip on frontal view. No acute displaced fracture or diastasis of the bones of the pelvis. There is no evidence of arthropathy or other focal bone abnormality.   Right lower lobe vascular clips and likely surgical staples.   IMPRESSION: Negative for acute traumatic injury.    Red flags (personal history of cancer, chills/fever, night sweats, nausea, vomiting, unrelenting pain, unexplained weight gain/loss): Positive for skin cancer; removed several months ago   PRECAUTIONS: None  WEIGHT BEARING RESTRICTIONS: No  FALLS: Has patient fallen in last 6 months? 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)  Hobbies: fishing, boating   Patient Goals: Reduce pain, able to go fishing     OBJECTIVE (data from initial evaluation unless otherwise dated):   Patient Surveys  QuickDASH: 75%  Posture Rounded shoulders, inc thoracic kyphosis, forward head  Gait Shortened unipedal stance time bilat, forward flexed posture worse with first few steps  Cervical Screen AROM: see below Spurlings A (ipsilateral lateral flexion/axial compression): R: Negative (pain L UT with R lateral flexion) L: Negative Distraction Test: Negative Hoffman Sign (cervical cord compression): R: Negative L: Negative ULTTs deferred  AROM AROM (Normal range in degrees) AROM 11/20/23  Cervical  Flexion (50) WNL  Extension (80) 30* (pain low back with extension)  Right lateral flexion (45) WNL  Left lateral flexion (45) WNL* (ipsilateral pain affecting medial L UT)   Right rotation (85) WNL  Left rotation (85) WNL* (pain L UT)   Right Left  Shoulder    Flexion 145 158  Extension    Abduction 170 172  External Rotation (arm at side) 56 60  Internal Rotation [facility]  Hands Behind Head    Hands Behind Back        Elbow    Flexion WNL WNL  Extension WNL WNL  Pronation    Supination    (* = pain; Blank rows = not tested)  UE MMT: MMT (out of 5) Right 11/20/23 Left 11/20/23      Shoulder   Flexion 4-* 4-*  Extension    Abduction 4+ 5-  External rotation 4-* 4-  Internal rotation 5 5  Horizontal abduction    Horizontal adduction    Lower Trapezius    Rhomboids        Elbow  Flexion 5 5  Extension 5 5  Pronation    Supination        (* = pain; Blank rows = not tested)  Sensation Negative for sensory deficit UE/LE   Reflexes Deferred  Palpation Location LEFT  RIGHT           Subocciptials    Cervical paraspinals    Upper Trapezius 0 0  Levator Scapulae    Rhomboid  Major/Minor    Sternoclavicular joint    Acromioclavicular joint 0 0  Coracoid process 0 0  Long head of biceps 0 0  Supraspinatus 0 0  Infraspinatus 0 0  Subscapularis    Teres Minor 0 0  Teres Major    Pectoralis Major 0 0  Pectoralis Minor    Anterior Deltoid 0 0  Lateral Deltoid 0 0  Posterior Deltoid 0 0  Latissimus Dorsi    Sternocleidomastoid    (Blank rows = not tested) Graded on 0-4 scale (0 = no pain, 1 = pain, 2 = pain with wincing/grimacing/flinching, 3 = pain with withdrawal, 4 = unwilling to allow palpation), (Blank rows = not tested)   SPECIAL TESTS Rotator Cuff  Drop Arm Test: Negative Painful Arc (Pain from 60 to 120 degrees scaption): Negative Infraspinatus Muscle Test: Positive If all 3 tests positive, the probability of a full-thickness rotator cuff tear is 91%  Subacromial Impingement Hawkins-Kennedy: Not examined Neer (Block scapula, PROM flexion): Negative Painful Arc (Pain from 60 to 120 degrees scaption): Negative Empty Can: Negative External Rotation Resistance: Positive Horizontal Adduction: Not examined Scapular Assist: Positive (11/22/23)    Bicep Tendon Pathology Speed (shoulder flexion to 90, external rotation, full elbow extension, and forearm supination with resistance: Positive Yergason's (resisted shoulder ER and supination/biceps tendon pathology): R Negative, L Positive (mild pain)  ___________________________________  LOWER QUARTER EXAM 11/22/23   AROM AROM (Normal range in degrees) AROM  11/22/23  Lumbar   Flexion (65) WNL  Extension (30) WNL  Right lateral flexion (25) WNL  Left lateral flexion (25) WNL  Right rotation (30) 75%* (mild R groin)  Left rotation (30) 75%* (mild R groin)      Hip Right Left  Flexion (125) 120* 120*  Extension (15)    Abduction (40) 40* 40  Adduction     Internal Rotation (45) 15 15  External Rotation (45) 40* 40*      (* = pain; Blank rows = not tested)  LE MMT: MMT (out of 5)  Right 11/22/23 Left 11/22/23  Hip flexion 4* 4*  Hip extension 4* 4*  Hip abduction 4-* 4-*  Hip adduction    Hip internal rotation    Hip external rotation    Knee flexion 5 5  Knee extension 5 5  Ankle dorsiflexion    Ankle plantarflexion    Ankle inversion    Ankle eversion    (* =  pain; Blank rows = not tested)  Sensation Deferred  Reflexes Deferred  Muscle Length Hamstrings: R: Negative L: Negative Ely (quadriceps): R: Positive L: Positive (no back pain)   Palpation Location Right Left         Lumbar paraspinals    Quadratus Lumborum    Gluteus Maximus 0 0  Gluteus Medius 0 0  Deep hip external rotators    PSIS    Fortin's Area (SIJ)    Greater Trochanter 0 0  Distal iliopsoas 1 1  (Blank rows = not tested) Graded on 0-4 scale (0 = no pain, 1 = pain, 2 = pain with wincing/grimacing/flinching, 3 = pain with withdrawal, 4 = unwilling to allow palpation)  Passive Accessory Intervertebral Motion Deferred  Special Tests Lumbar Radiculopathy and Discogenic: Slump (SN 83, -LR 0.32): R: Not examined L: Not examined SLR (SN 92, -LR 0.29): R: Negative L:  Negative Crossed SLR (SP 90): R: Negative L: Negative Prone Knee Bend: R: Negative L: Negative  Facet Joint: Extension-Rotation (SN 100, -LR 0.0): R: Negative L: Negative  Lumbar Foraminal Stenosis: Lumbar quadrant (SN 70): R: Negative L: Negative  Hip: FABER (SN 81): R: Positive L: Positive FADIR (SN 94): R: Not examined L: Not examined Hip scour (SN 50): R: Positive L: Positive  Piriformis Syndrome: PACE sign: Negative     TODAY'S TREATMENT   12/06/2023   SUBJECTIVE STATEMENT:   Patient reports some posterior neck pain and bilateral shoulder pain with certain movements. Patient reports some soreness with movements in IP joints of bilat 4th-5th digits. Pt reports notable night sweats last night. Patient denies fever. Patient reports better pain today versus Tuesday; it got better yesterday afternoon.  1/10 pain this AM largely affecting R>L shoulder.    Cervical spine compression/distraction: Negative  Spurling's: R Negative, L Negative  ULTT: Ulnar nerve: R Negative, L Negative   Manual Therapy - for symptom modulation, soft tissue sensitivity and mobility, joint mobility, ROM   Supine: L shoulder shoulder glenohumeral distraction, grade III; 2 x 30 sec bouts R and L shoulder PROM: flexion, abduction, ER, IR  -R shoulder Pain with end-range abduction, flexion  -L shoulder: pain with initial flexion from  neutral Glenohumeral mobilization: R and L shoulder today, posterior and inferior glides grade III; 3 x 30 sec per direction  Bilat hip PROM: flexion, IR, ER; to check for motion loss - L hip: WNL - R hip: Mild discomfort with end-range ER   Not Performed Today:  Mulligan belt lateral distraction 2 x 30-sec bouts, R side today  MWM R hip; lateral distraction with hip flexion and ER as tolerated   Therapeutic Exercise - for improved soft tissue flexibility and extensibility as needed for ROM, improved strength as needed to improve performance of CKC activities/functional movements     Supine butterfly stretch; 3 x 30 sec  Bridge, with Green Tband hip abduction isometric; 2 x 10 Quadruped fire hydrant; x20 each LE Sidelying hip abduction; 2 x 10  ___________   Serratus slide with Red band and foam roll; 2 x 8   PATIENT EDUCATION: Discussed continued HEP and PT POC.    NOT TODAY Bilat ER with scap retraction, back to wall; Green Tband; 2 x 10 Supine Shoulder Flexion with Dowel, 1x8 Supine wand ER AAROM; x20 on R and L UE Pulleys, forward elevation; x 2 min  PATIENT EDUCATION:  Education details: see above for patient education details Person educated: Patient Education method: Explanation, Demonstration, and Handouts Education comprehension:  verbalized understanding and returned demonstration   HOME EXERCISE PROGRAM:  Access Code: [email or web address] URL:  [email or web address] Date: 11/20/2023 Prepared by: Venetia Endo  Exercises  - Supine Shoulder Flexion with Dowel  - 2 x daily - 7 x weekly - 2 sets - 10 reps - Supine Shoulder External Rotation with Dowel  - 2 x daily - 7 x weekly - 2 sets - 10 reps - Seated Scapular Retraction  - 2 x daily - 7 x weekly - 2 sets - 10 reps - 3sec hold  ASSESSMENT:  CLINICAL IMPRESSION: Pt unfortunately has experienced significant intermittent bilat (R>L) shoulder and hip pain with severe episodic pain and brief intervals of minimal to no pain. Patient is planning to follow up with referring provider to consider further diagnostic workup for potential systemic/auto-immune cause given diffuse and severe/episodic nature of symptoms. Pt has history of sarcoidosis and thinks some of the symptoms he's feeling are similar to previous experience of sarcoidosis. Pt fortunately has minimal pain today and only mild R shoulder ROM deficit (into abduction) that is improved with manual therapy. We continued with posterior cuff strengthening and scapular control/serratus activaiton work as well as gluteal strengthening. Pt will continue to benefit from skilled PT services to address deficits and improve function.   OBJECTIVE IMPAIRMENTS: Abnormal gait, decreased mobility, difficulty walking, decreased ROM, decreased strength, hypomobility, impaired flexibility, impaired UE functional use, postural dysfunction, and pain.   ACTIVITY LIMITATIONS: carrying, lifting, bending, stairs, transfers, bed mobility, sleeping, and reach over head  PARTICIPATION LIMITATIONS: shopping, community activity, occupation, yard work, and fishing/boating participation  PERSONAL FACTORS: Past/current experiences, Profession, and 3+ comorbidities: (depression/anxiety, HTN) are also affecting patient's functional outcome.   REHAB POTENTIAL: Good  CLINICAL DECISION MAKING: Unstable/unpredictable  EVALUATION COMPLEXITY:  High   GOALS: Goals reviewed with patient? Yes  SHORT TERM GOALS: Target date: 12/12/2023  Pt will be independent with HEP to improve strength and decrease shoulder pain to improve pain-free function at home and work. Baseline: 11/20/23: Baseline HEP initiated for upper body; will further develop LE HEP at future f/u.  Goal status: INITIAL   LONG TERM GOALS: Target date: 01/17/2024  Pt will have shoulder forward flexion to 160 deg or greater bilaterally and functional ER/IR WFL without reproduction of pain as needed for overhead activity and functional reaching tasks Baseline: 11/20/23: Motion loss in forward elevation and ER.  Goal status: INITIAL  2.  Pt will decrease worst shoulder pain by at least 3 points on the NPRS in order to demonstrate clinically significant reduction in shoulder pain. Baseline: 11/20/23: 10/10 at worst.  Goal status: INITIAL  3.  Pt will decrease quick DASH score to no more than 20% in order to demonstrate clinically significant reduction in disability related to shoulder pain       Baseline: 11/20/23: 75% Goal status: INITIAL  4. Pt will increase strength of deltoid and posterior cuff mm to at least 4+/5 or greater MMT grade in order to demonstrate improvement in strength and function Baseline: 11/20/23: Shoulder flexion and ER 4-/5 with pain reproduction.  Goal status: INITIAL  5. Pt will improve strength of flexors, abductors, extensors to 4+/5 or greater MMT grade indicative of improved proximal/hip strength as needed for improved tolerance of load-bearing activities and transferring Baseline: 11/22/23: Shoulder flexion and ER 4-/5 with pain reproduction.  Goal status: INITIAL  6. Pt will have pain-free sit to stand and stand-to-sit transfer without reproduction of pain as needed for household and community mobility and getting up/down from  office chair during workday.  Baseline: 11/22/23: Notable pain behaviors with transfers. Goal status: INITIAL  PLAN: PT  FREQUENCY: 1-2x/week  PT DURATION: 8 weeks  PLANNED INTERVENTIONS: Therapeutic exercises, Therapeutic activity, Neuromuscular re-education, Balance training, Gait training, Patient/Family education, Self Care, Joint mobilization, Joint manipulation, Vestibular training, Canalith repositioning, Orthotic/Fit training, DME instructions, Dry Needling, Electrical stimulation, Spinal manipulation, Spinal mobilization, Cryotherapy, Moist heat, Taping, Traction, Ultrasound, Ionotophoresis 4mg /ml Dexamethasone, Manual therapy, and Re-evaluation.  PLAN FOR NEXT SESSION: Continue with manual techniques for GJH and scapular mobility. AAROM and periscapular isotonics as tolerated if pain levels are still high. Manual techniques/Mulligan mobilizations for hips dependent on pain.    Venetia Endo, PT, DPT #[id number]  Venetia ONEIDA Endo, PT 12/06/2023, 9:06 AM

## 2023-12-06 NOTE — Telephone Encounter (Signed)
 Please review.  KP

## 2023-12-07 ENCOUNTER — Ambulatory Visit
Admission: RE | Admit: 2023-12-07 | Discharge: 2023-12-07 | Disposition: A | Discharge status: Home / Self Care | Source: Ambulatory Visit | Attending: Physician Assistant | Admitting: Physician Assistant

## 2023-12-07 ENCOUNTER — Ambulatory Visit (INDEPENDENT_AMBULATORY_CARE_PROVIDER_SITE_OTHER): Admitting: Physician Assistant

## 2023-12-07 ENCOUNTER — Encounter: Payer: Self-pay | Admitting: Physician Assistant

## 2023-12-07 ENCOUNTER — Ambulatory Visit
Admission: RE | Admit: 2023-12-07 | Discharge: 2023-12-07 | Disposition: A | Discharge status: Home / Self Care | Attending: Physician Assistant | Admitting: Physician Assistant

## 2023-12-07 VITALS — BP 138/80 | HR 75 | Temp 98.5°F | Ht 71.0 in | Wt 223.0 lb

## 2023-12-07 DIAGNOSIS — D869 Sarcoidosis, unspecified: Secondary | ICD-10-CM

## 2023-12-07 DIAGNOSIS — M159 Polyosteoarthritis, unspecified: Secondary | ICD-10-CM | POA: Diagnosis not present

## 2023-12-07 DIAGNOSIS — Z91018 Allergy to other foods: Secondary | ICD-10-CM | POA: Insufficient documentation

## 2023-12-07 DIAGNOSIS — M2550 Pain in unspecified joint: Secondary | ICD-10-CM | POA: Insufficient documentation

## 2023-12-07 MED ORDER — CELECOXIB 200 MG PO CAPS
200.0000 mg | ORAL_CAPSULE | Freq: Every day | ORAL | 1 refills | Status: DC | PRN
Start: 2023-12-07 — End: 2024-02-13

## 2023-12-07 NOTE — Progress Notes (Signed)
 Date:  12/07/2023   Name:  Javier HANK Sr.   DOB:  [birth year]   MRN:  [id number]   Chief Complaint: Joint Pain (Comes and goes /)  HPI Javier Martin returns for follow up on episodic polyarthralgia as described below. Today is a relatively good day, but he has days he can hardly move.  I am experiencing off and on bouts of pain. It has gotten to where the pain in is both groins, both hips, both shoulders, back of neck/skull, both hands. It appears for 3 or 4 days and then eases off for a few days and starts all over again. When it appears now, I have days where i can't move without sharp pains. Scale of 1 -10 is at a 10. It reminds me of how the Sarcoid affected me back 30 yrs ago.   He also endorses prior dx of alpha-gal. Has been trying to see if his joint pain is related to any particular food pattern including red meat and dairy.   Medication list has been reviewed and updated.  Current Meds  Medication Sig   albuterol (PROVENTIL HFA;VENTOLIN HFA) 108 (90 BASE) MCG/ACT inhaler Inhale into the lungs every 6 (six) hours as needed for wheezing or shortness of breath.   buPROPion (WELLBUTRIN XL) 300 MG 24 hr tablet take 1 tablet by mouth every morning   celecoxib  (CELEBREX ) 200 MG capsule Take 1 capsule (200 mg total) by mouth daily as needed (for joint pain). Take with food.  Do not take with other NSAIDs (ibuprofen, naproxen, aspirin, etc.)   chlorthalidone (HYGROTON) 25 MG tablet take 1 tablet by mouth every morning   clindamycin  (CLEOCIN  T) 1 % SWAB Apply 1 application  topically See admin instructions. 1 - 2 times daily prn for back of neck   dicyclomine  (BENTYL ) 20 MG tablet TAKE ONE TABLET BY MOUTH EVERY 6 HOURS as needed for IBS (Patient taking differently: 2 (two) times daily.)   finasteride  (PROPECIA ) 1 MG tablet Take 1 tablet (1 mg total) by mouth daily.   furosemide (LASIX) 20 MG tablet Take 20 mg by mouth as needed for edema or fluid.   hydrocortisone  (ANUSOL -HC) 25 MG  suppository Place 1 suppository (25 mg total) rectally 2 (two) times daily.   hyoscyamine (ANASPAZ) 0.125 MG TBDP disintergrating tablet Place 0.125 mg under the tongue every 6 (six) hours as needed.   lisinopril (ZESTRIL) 20 MG tablet TAKE ONE TABLET BY MOUTH ONCE DAILY   loperamide  (IMODIUM  A-D) 2 MG tablet Take 1 tablet (2 mg total) by mouth 2 (two) times daily as needed for diarrhea or loose stools. (Patient taking differently: Take 2 mg by mouth daily.)   MAGNESIUM PO Take by mouth.   pantoprazole  (PROTONIX ) 40 MG tablet Take 1 tablet (40 mg total) by mouth daily.   tadalafil (CIALIS) 20 MG tablet Take 20 mg by mouth every morning.   Testosterone (TESTOPEL) 75 MG PLLT    valACYclovir (VALTREX) 1000 MG tablet Take 2,000 mg by mouth.   [DISCONTINUED] Charcoal 200 MG CAPS Take 200 mg by mouth.     Review of Systems  Patient Active Problem List   Diagnosis Date Noted   Polyarthralgia 12/07/2023   Allergy to alpha-gal 12/07/2023   Generalized osteoarthritis of multiple sites 10/29/2023   History of adenomatous polyp of colon 08/13/2023   Restless legs 08/13/2023   Basal cell carcinoma of skin 05/31/2023   Hypertensive disorder 05/31/2023   Lymphedema 05/05/2023   Lateral epicondylitis of right  elbow 11/16/2022   Mild asthma 10/11/2020   History of iron deficiency 10/11/2020   GERD (gastroesophageal reflux disease) 07/13/2020   Benign essential HTN 07/13/2020   Sarcoidosis 07/13/2020   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   Sleep apnea 07/13/2020   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/2020   IBS (irritable bowel syndrome) 07/13/2020   Hypogonadism in male 07/13/2020   ED (erectile dysfunction) 07/13/2020   Depression with anxiety 07/13/2020   Chronic fatigue 07/13/2020   Decreased testosterone level 05/19/2020    Allergies  Allergen Reactions   Nutritional Supplements Hives   Red Dye    Milk (Cow) Other (See Comments)    Other Reaction: GI Upset  Other reaction(s): Other (See Comments)  Other  Reaction: GI Upset  Other Reaction: GI Upset    Other reaction(s): Other (See Comments) Other Reaction: GI Upset    Immunization History  Administered Date(s) Administered   Hepatitis A, Adult 05/25/2014, 12/23/2014   Influenza-Unspecified 02/16/2019, 02/16/2020   PFIZER(Purple Top)SARS-COV-2 Vaccination 07/04/2019, 07/24/2019, 04/01/2020   PNEUMOCOCCAL CONJUGATE-20 08/23/2020   Tdap 06/04/2018, 06/06/2023   Zoster Recombinant(Shingrix) 03/20/2018, 05/22/2018    Past Surgical History:  Procedure Laterality Date   APPENDECTOMY  1993   COLONOSCOPY WITH PROPOFOL  N/A 04/01/2015   Procedure: COLONOSCOPY WITH PROPOFOL ;  Surgeon: Lamar ONEIDA Holmes, MD;  Location: [facility] ENDOSCOPY;  Service: Endoscopy;  Laterality: N/A;   EYE SURGERY  2010   Lasix   NASAL SEPTUM SURGERY  04/18/2007   pillar procedure  04/18/2007   TONSILLECTOMY      Social History   Tobacco Use   Smoking status: Never   Smokeless tobacco: Never  Vaping Use   Vaping status: Never Used  Substance Use Topics   Alcohol use: Not Currently   Drug use: No    Family History  Problem Relation Age of Onset   Hypertension Mother    Stroke Mother    Hearing loss Mother    Miscarriages / Stillbirths Mother    Hypertension Father    Colon cancer Father    Alcohol abuse Father    Cancer Father    Stroke Father    Arthritis Brother    Hypertension Brother    Vision loss Brother         12/07/2023    1:21 PM 10/15/2023    8:41 AM 08/13/2023    2:25 PM  GAD 7 : Generalized Anxiety Score  Nervous, Anxious, on Edge 2 3 3   Control/stop worrying 0 3 3  Worry too much - different things 3 3 3   Trouble relaxing 2 3 3   Restless 0 3 3  Easily annoyed or irritable 0 3 3  Afraid - awful might happen 0 1 0  Total GAD 7 Score 7 19 18   Anxiety Difficulty Somewhat difficult Extremely difficult Extremely difficult       12/07/2023    1:20 PM 10/15/2023    8:41 AM 08/13/2023    2:24 PM  Depression screen PHQ 2/9   Decreased Interest 2 2 3   Down, Depressed, Hopeless 0 2 3  PHQ - 2 Score 2 4 6   Altered sleeping 3 0 3  Tired, decreased energy 3 3 3   Change in appetite 0 2 2  Feeling bad or failure about yourself  0 0 0  Trouble concentrating 1 3 3   Moving slowly or fidgety/restless 3 3 3   Suicidal thoughts 0 0 0  PHQ-9 Score 12 15 20   Difficult doing work/chores Extremely dIfficult Somewhat difficult Extremely dIfficult  BP Readings from Last 3 Encounters:  12/07/23 138/80  11/05/23 138/79  10/15/23 124/80    Wt Readings from Last 3 Encounters:  12/07/23 223 lb (101.2 kg)  11/05/23 225 lb 3.2 oz (102.2 kg)  10/15/23 225 lb (102.1 kg)    BP 138/80   Pulse 75   Temp 98.5 F (36.9 C)   Ht 5' 11 (1.803 m)   Wt 223 lb (101.2 kg)   SpO2 97%   BMI 31.10 kg/m   Physical Exam Vitals and nursing note reviewed.  Constitutional:      Appearance: Normal appearance.  Cardiovascular:     Rate and Rhythm: Normal rate.  Pulmonary:     Effort: Pulmonary effort is normal.  Abdominal:     General: There is no distension.  Musculoskeletal:        General: Normal range of motion.  Skin:    General: Skin is warm and dry.  Neurological:     Mental Status: He is alert and oriented to person, place, and time.     Gait: Gait is intact.  Psychiatric:        Mood and Affect: Mood and affect normal.     Recent Labs  No results found for: NA, K, CL, CO2, GLUCOSE, BUN, CREATININE, CALCIUM, PROT, ALBUMIN, AST, ALT, ALKPHOS, BILITOT, GFRNONAA, GFRAA  No results found for: WBC, HGB, HCT, MCV, PLT No results found for: HGBA1C No results found for: CHOL, HDL, LDLCALC, LDLDIRECT, TRIG, CHOLHDL No results found for: TSH    Assessment and Plan:  1. Sarcoidosis (Primary) Check labs as below. Obtain CXR. Will prescribe prednisone  in bursts PRN in the near future. He's going on a fishing trip soon so I've advised him to simply send a  message if it flares up on him over the trip and Celebrex  is not helping.  Referring to rheum.   - Rheumatoid factor - CYCLIC CITRUL PEPTIDE ANTIBODY, IGG/IGA - Sedimentation rate - C-reactive protein - ANA w/Reflex if Positive - DG Chest 2 View; Future - CBC with Differential/Platelet - Comprehensive metabolic panel with GFR - Ambulatory referral to Rheumatology  2. Polyarthralgia Check rheum. Autoimmune, and lyme labs.   - Rheumatoid factor - CYCLIC CITRUL PEPTIDE ANTIBODY, IGG/IGA - Sedimentation rate - C-reactive protein - ANA w/Reflex if Positive - Ambulatory referral to Rheumatology - Lyme Disease Serology w/Reflex  3. Generalized osteoarthritis of multiple sites Try celecoxib  once daily PRN, with food. GFR generally in the 60's.   - Rheumatoid factor - CYCLIC CITRUL PEPTIDE ANTIBODY, IGG/IGA - Sedimentation rate - C-reactive protein - ANA w/Reflex if Positive - celecoxib  (CELEBREX ) 200 MG capsule; Take 1 capsule (200 mg total) by mouth daily as needed (for joint pain). Take with food.  Do not take with other NSAIDs (ibuprofen, naproxen, aspirin, etc.)  Dispense: 30 capsule; Refill: 1  4. Allergy to alpha-gal Plan to repeat alpha gal panel and food allergy panel at a future visit, as we are already doing extensive blood work today. For now, advised to avoid red meat and try to identify food triggers.     F/u as sched 01/16/24   Rolan Hoyle, PA-C, DMSc, Nutritionist [facility] [phone]

## 2023-12-10 LAB — LYME DISEASE SEROLOGY W/REFLEX: Lyme Total Antibody EIA: NEGATIVE

## 2023-12-11 ENCOUNTER — Ambulatory Visit: Payer: Self-pay | Admitting: Physician Assistant

## 2023-12-11 ENCOUNTER — Ambulatory Visit: Admitting: Physical Therapy

## 2023-12-11 ENCOUNTER — Encounter: Payer: Self-pay | Admitting: Physical Therapy

## 2023-12-11 DIAGNOSIS — R262 Difficulty in walking, not elsewhere classified: Secondary | ICD-10-CM

## 2023-12-11 DIAGNOSIS — M25551 Pain in right hip: Secondary | ICD-10-CM

## 2023-12-11 DIAGNOSIS — M25511 Pain in right shoulder: Secondary | ICD-10-CM

## 2023-12-11 DIAGNOSIS — M6281 Muscle weakness (generalized): Secondary | ICD-10-CM

## 2023-12-11 LAB — RHEUMATOID FACTOR: Rheumatoid fact SerPl-aCnc: <10 [IU]/mL (ref <14.0)

## 2023-12-11 LAB — COMPREHENSIVE METABOLIC PANEL WITH GFR
ALT: 23 IU/L (ref 0–44)
AST: 23 IU/L (ref 0–40)
Albumin: 4.5 g/dL (ref 3.9–4.9)
Alkaline Phosphatase: 78 IU/L (ref 44–121)
BUN/Creatinine Ratio: 14 (ref 10–24)
BUN: 17 mg/dL (ref 8–27)
Bilirubin Total: 0.4 mg/dL (ref 0.0–1.2)
CO2: 24 mmol/L (ref 20–29)
Calcium: 9.6 mg/dL (ref 8.6–10.2)
Chloride: 99 mmol/L (ref 96–106)
Creatinine, Ser: 1.19 mg/dL (ref 0.76–1.27)
Globulin, Total: 3 g/dL (ref 1.5–4.5)
Glucose: 77 mg/dL (ref 70–99)
Potassium: 4 mmol/L (ref 3.5–5.2)
Sodium: 138 mmol/L (ref 134–144)
Total Protein: 7.5 g/dL (ref 6.0–8.5)
eGFR: 68 mL/min/1.73 (ref >59)

## 2023-12-11 LAB — CBC WITH DIFFERENTIAL/PLATELET
Basophils Absolute: 0.1 x10E3/uL (ref 0.0–0.2)
Basos: 1 %
EOS (ABSOLUTE): 0.2 x10E3/uL (ref 0.0–0.4)
Eos: 2 %
Hematocrit: 47.2 % (ref 37.5–51.0)
Hemoglobin: 15.1 g/dL (ref 13.0–17.7)
Immature Grans (Abs): 0 x10E3/uL (ref 0.0–0.1)
Immature Granulocytes: 0 %
Lymphocytes Absolute: 1.6 x10E3/uL (ref 0.7–3.1)
Lymphs: 20 %
MCH: 30.7 pg (ref 26.6–33.0)
MCHC: 32 g/dL (ref 31.5–35.7)
MCV: 96 fL (ref 79–97)
Monocytes Absolute: 0.7 x10E3/uL (ref 0.1–0.9)
Monocytes: 9 %
Neutrophils Absolute: 5.4 x10E3/uL (ref 1.4–7.0)
Neutrophils: 68 %
Platelets: 336 x10E3/uL (ref 150–450)
RBC: 4.92 x10E6/uL (ref 4.14–5.80)
RDW: 12.3 % (ref 11.6–15.4)
WBC: 8 x10E3/uL (ref 3.4–10.8)

## 2023-12-11 LAB — CYCLIC CITRUL PEPTIDE ANTIBODY, IGG/IGA: Cyclic Citrullin Peptide Ab: 10 U (ref 0–19)

## 2023-12-11 LAB — C-REACTIVE PROTEIN: CRP: 11 mg/L — ABNORMAL HIGH (ref 0–10)

## 2023-12-11 LAB — ANA W/REFLEX IF POSITIVE: Anti Nuclear Antibody (ANA): NEGATIVE

## 2023-12-11 LAB — SEDIMENTATION RATE: Sed Rate: 33 mm/h — ABNORMAL HIGH (ref 0–30)

## 2023-12-11 NOTE — Therapy (Signed)
 OUTPATIENT PHYSICAL THERAPY TREATMENT  Patient Name: Javier PULSIFER Sr. MRN: [id number] DOB:10/04/1960, 63 y.o., male Today's Date: 12/11/2023  END OF SESSION:  PT End of Session - 12/11/23 0858     Visit Number 7    Number of Visits 17    Date for PT Re-Evaluation 01/15/24    Authorization Type [employer] 2025: VL 60 combined PT/OT/Speech    PT Start Time 0859    PT Stop Time 0945    PT Time Calculation (min) 46 min    Activity Tolerance Patient limited by pain    Behavior During Therapy [facility] for tasks assessed/performed          Past Medical History:  Diagnosis Date   Allergy 1960   Anxiety 1960   Asthma 1970   Basal cell carcinoma 03/07/2022   Right inf cheek - [facility]   Dysplastic nevus 02/13/2019   Left low back, 11cm lat. to spine. Mild atypia, peripheral and deep margins involved.    GERD (gastroesophageal reflux disease) 1990   Hypertension 1980   Inactive TB 1999   Sarcoidosis 1997-2000   Sleep apnea 1990   Tuberculosis 1990   Inactive.   Past Surgical History:  Procedure Laterality Date   APPENDECTOMY  1993   COLONOSCOPY WITH PROPOFOL  N/A 04/01/2015   Procedure: COLONOSCOPY WITH PROPOFOL ;  Surgeon: Lamar ONEIDA Holmes, MD;  Location: [facility] ENDOSCOPY;  Service: Endoscopy;  Laterality: N/A;   EYE SURGERY  2010   Lasix   NASAL SEPTUM SURGERY  04/18/2007   pillar procedure  04/18/2007   TONSILLECTOMY     Patient Active Problem List   Diagnosis Date Noted   Polyarthralgia 12/07/2023   Allergy to alpha-gal 12/07/2023   Generalized osteoarthritis of multiple sites 10/29/2023   History of adenomatous polyp of colon 08/13/2023   Restless legs 08/13/2023   Basal cell carcinoma of skin 05/31/2023   Hypertensive disorder 05/31/2023   Lymphedema 05/05/2023   Lateral epicondylitis of right elbow 11/16/2022   Mild asthma 10/11/2020   History of iron deficiency 10/11/2020   GERD (gastroesophageal reflux disease) 07/13/2020   Benign essential HTN 07/13/2020    Sarcoidosis 07/13/2020   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   Sleep apnea 07/13/2020   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/2020   IBS (irritable bowel syndrome) 07/13/2020   Hypogonadism in male 07/13/2020   ED (erectile dysfunction) 07/13/2020   Depression with anxiety 07/13/2020   Chronic fatigue 07/13/2020   Decreased testosterone level 05/19/2020    PCP: Toribio SHAUNNA Hoyle, PA  REFERRING PROVIDER: Toribio SHAUNNA Hoyle, PA  REFERRING DIAG: M15.9 (ICD-10-CM) - Generalized osteoarthritis of multiple sites   RATIONALE FOR EVALUATION AND TREATMENT: Rehabilitation  THERAPY DIAG: Bilateral shoulder pain, unspecified chronicity  Bilateral hip pain  Muscle weakness (generalized)  Difficulty in walking, not elsewhere classified  ONSET DATE: 3 months ago  FOLLOW-UP APPT SCHEDULED WITH REFERRING PROVIDER: None for this condition scheduled soon   PERTINENT HISTORY: Pt is a 63 year old male referred from PCP for osteoarthritis of multiple joints; he has Hx of groin pain beginning on R side than hurting L. Then, he had R shoulder pain with eventual progression to L shoulder pain. Pt reports primarily L>R shoulder pain this AM. No numbness/tingling.   Pain began with groin pain with each step. He had R shoulder pain starting after that. He had prednisone  taper that made it better. Patient reports that pain progressed into involving bilateral shoulders and bilateral groin region. Pt reports this past Friday, he started having pain along base of  his neck and was having severe pain this past weekend. He reports more popping in his R shoulder over this past week. Pt reports taking magnesium seems to have helped; condition is better with taking this and resting. Pt reports sarcoidosis diagnosis 25 years ago that led to multi-site joint pain. Pt reports 20 years since significant flare-up of this condition. 50 out of 10 pain when it's flared up.   Pt reports notable pain with attempting to lift his R arm.   PAIN:  Pain  Intensity: Present: 3/10, Best: 0-1/10 (on prednisone ), Worst: 10/10 Pain location: L deltoid > R deltoid/ACJ region; R and L groin/medial thigh pain Pain Quality: aching , intermittent sharp pain with specific motion  Radiating: No  Numbness/Tingling: No N//T Focal Weakness: No Aggravating factors: transferring from bed/chair/couch, rolling in bed, walking, sitting Relieving factors: prednisone , magnesium supplement 24-hour pain behavior: No specific time of day  History of prior shoulder or neck/shoulder injury, pain, surgery, or therapy: No Falls: Has patient fallen in last 6 months? No, Number of falls: N/A Dominant hand: right  Imaging: Yes ;  CLINICAL DATA:  atraumatic right shoulder pain x3wk  EXAM: RIGHT SHOULDER - 2+ VIEW  COMPARISON:  None Available.  FINDINGS: No acute fracture or dislocation. Moderate AC joint space loss. Soft tissues are unremarkable. ...     Narrative & Impression  CLINICAL DATA:  atraumatic right hip/groin pain x3 weeks, painful gait   EXAM: DG HIP (WITH OR WITHOUT PELVIS) 2-3V RIGHT   COMPARISON:  None Available.   FINDINGS: There is no evidence of hip fracture or dislocation of the right hip. No acute displaced fracture left hip on frontal view. No acute displaced fracture or diastasis of the bones of the pelvis. There is no evidence of arthropathy or other focal bone abnormality.   Right lower lobe vascular clips and likely surgical staples.   IMPRESSION: Negative for acute traumatic injury.    Red flags (personal history of cancer, chills/fever, night sweats, nausea, vomiting, unrelenting pain, unexplained weight gain/loss): Positive for skin cancer; removed several months ago   PRECAUTIONS: None  WEIGHT BEARING RESTRICTIONS: No  FALLS: Has patient fallen in last 6 months? 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)  Hobbies: fishing, boating   Patient Goals: Reduce pain, able to go fishing     OBJECTIVE (data from initial evaluation unless otherwise dated):   Patient Surveys  QuickDASH: 75%  Posture Rounded shoulders, inc thoracic kyphosis, forward head  Gait Shortened unipedal stance time bilat, forward flexed posture worse with first few steps  Cervical Screen AROM: see below Spurlings A (ipsilateral lateral flexion/axial compression): R: Negative (pain L UT with R lateral flexion) L: Negative Distraction Test: Negative Hoffman Sign (cervical cord compression): R: Negative L: Negative ULTTs deferred  AROM AROM (Normal range in degrees) AROM 11/20/23  Cervical  Flexion (50) WNL  Extension (80) 30* (pain low back with extension)  Right lateral flexion (45) WNL  Left lateral flexion (45) WNL* (ipsilateral pain affecting medial L UT)   Right rotation (85) WNL  Left rotation (85) WNL* (pain L UT)   Right  Left  Shoulder    Flexion 145 158  Extension    Abduction 170 172  External Rotation (arm at side) 56 60  Internal Rotation [facility] WFL  Hands Behind Head    Hands Behind Back        Elbow    Flexion WNL WNL  Extension WNL WNL  Pronation    Supination    (* = pain; Blank rows = not tested)  UE MMT: MMT (out of 5) Right 11/20/23 Left 11/20/23      Shoulder   Flexion 4-* 4-*  Extension    Abduction 4+ 5-  External rotation 4-* 4-  Internal rotation 5 5  Horizontal abduction    Horizontal adduction    Lower Trapezius    Rhomboids        Elbow  Flexion 5 5  Extension 5 5  Pronation    Supination        (* = pain; Blank rows = not tested)  Sensation Negative  for sensory deficit UE/LE   Reflexes Deferred  Palpation Location LEFT  RIGHT           Subocciptials    Cervical paraspinals    Upper Trapezius 0 0  Levator Scapulae    Rhomboid Major/Minor    Sternoclavicular joint    Acromioclavicular joint 0 0  Coracoid process 0 0  Long head of biceps 0 0  Supraspinatus 0 0  Infraspinatus 0 0  Subscapularis    Teres Minor 0 0  Teres Major    Pectoralis Major 0 0  Pectoralis Minor    Anterior Deltoid 0 0  Lateral Deltoid 0 0  Posterior Deltoid 0 0  Latissimus Dorsi    Sternocleidomastoid    (Blank rows = not tested) Graded on 0-4 scale (0 = no pain, 1 = pain, 2 = pain with wincing/grimacing/flinching, 3 = pain with withdrawal, 4 = unwilling to allow palpation), (Blank rows = not tested)   SPECIAL TESTS Rotator Cuff  Drop Arm Test: Negative Painful Arc (Pain from 60 to 120 degrees scaption): Negative Infraspinatus Muscle Test: Positive If all 3 tests positive, the probability of a full-thickness rotator cuff tear is 91%  Subacromial Impingement Hawkins-Kennedy: Not examined Neer (Block scapula, PROM flexion): Negative Painful Arc (Pain from 60 to 120 degrees scaption): Negative Empty Can: Negative External Rotation Resistance: Positive Horizontal Adduction: Not examined Scapular Assist: Positive (11/22/23)    Bicep Tendon Pathology Speed (shoulder flexion to 90, external rotation, full elbow extension, and forearm supination with resistance: Positive Yergason's (resisted shoulder ER and supination/biceps tendon pathology): R Negative, L Positive (mild pain)  ___________________________________  LOWER QUARTER EXAM 11/22/23   AROM AROM (Normal range in degrees) AROM  11/22/23  Lumbar   Flexion (65) WNL  Extension (30) WNL  Right lateral flexion (25) WNL  Left lateral flexion (25) WNL  Right rotation (30) 75%* (mild R groin)  Left rotation (30) 75%* (mild R groin)      Hip Right Left  Flexion (125) 120* 120*  Extension  (15)    Abduction (40) 40* 40  Adduction     Internal Rotation (45) 15 15  External Rotation (45) 40* 40*      (* = pain; Blank rows = not tested)  LE MMT: MMT (out of 5) Right 11/22/23 Left 11/22/23  Hip flexion 4* 4*  Hip extension 4* 4*  Hip abduction 4-* 4-*  Hip adduction    Hip internal rotation    Hip external  rotation    Knee flexion 5 5  Knee extension 5 5  Ankle dorsiflexion    Ankle plantarflexion    Ankle inversion    Ankle eversion    (* = pain; Blank rows = not tested)  Sensation Deferred  Reflexes Deferred  Muscle Length Hamstrings: R: Negative L: Negative Ely (quadriceps): R: Positive L: Positive (no back pain)   Palpation Location Right Left         Lumbar paraspinals    Quadratus Lumborum    Gluteus Maximus 0 0  Gluteus Medius 0 0  Deep hip external rotators    PSIS    Fortin's Area (SIJ)    Greater Trochanter 0 0  Distal iliopsoas 1 1  (Blank rows = not tested) Graded on 0-4 scale (0 = no pain, 1 = pain, 2 = pain with wincing/grimacing/flinching, 3 = pain with withdrawal, 4 = unwilling to allow palpation)  Passive Accessory Intervertebral Motion Deferred  Special Tests Lumbar Radiculopathy and Discogenic: Slump (SN 83, -LR 0.32): R: Not examined L: Not examined SLR (SN 92, -LR 0.29): R: Negative L:  Negative Crossed SLR (SP 90): R: Negative L: Negative Prone Knee Bend: R: Negative L: Negative  Facet Joint: Extension-Rotation (SN 100, -LR 0.0): R: Negative L: Negative  Lumbar Foraminal Stenosis: Lumbar quadrant (SN 70): R: Negative L: Negative  Hip: FABER (SN 81): R: Positive L: Positive FADIR (SN 94): R: Not examined L: Not examined Hip scour (SN 50): R: Positive L: Positive  Piriformis Syndrome: PACE sign: Negative     TODAY'S TREATMENT   12/11/2023   SUBJECTIVE STATEMENT:   Patient reports 1-2/10 pain at arrival. He reports pain along R shoulder and affecting R paracervical/UT region. Pt reports involvement of  hands/fingers without numbness or paresthesias. Pt states the only joint that hasn't hurt has been the ankles. Pt reports R posterior hip/posterolateral hip pain at arrival. Pt will be out of town for the month of September and likely would not be attending PT regularly at that time.      Manual Therapy - for symptom modulation, joint mobility, ROM   Supine: R shoulder shoulder glenohumeral distraction, grade III; 2 x 30 sec bouts Glenohumeral mobilization: R shoulder today, posterior and inferior glides grade III; 3 x 30 sec per direction ______________  Arliss belt lateral distraction 2 x 30-sec bouts, R side today  MWM R hip; lateral distraction with hip flexion and ER as tolerated; x 10 reps per direction  -improved tolerance of flexion and ER    Therapeutic Exercise - for improved soft tissue flexibility and extensibility as needed for ROM, improved strength as needed to improve performance of CKC activities/functional movements   Quadruped CARs (articular rotations/circles); x 10 on either side  Bridge, with Blue Tband hip abduction isometric; 2 x 10 Sidelying hip abduction; 2 x 10  ___________  Standing shoulder adduction with Red Tband (linked to top hook); abduction assist; 2 x 10 on either side   PATIENT EDUCATION: Discussed ongoing diagnostic workup per referring provider and current PT POC.    NOT TODAY Serratus slide with Red band and foam roll; 2 x 8 Supine butterfly stretch; 3 x 30 sec  Bilat ER with scap retraction, back to wall; Green Tband; 2 x 10 Supine Shoulder Flexion with Dowel, 1x8 Supine wand ER AAROM; x20 on R and L UE Pulleys, forward elevation; x 2 min  PATIENT EDUCATION:  Education details: see above for patient education details Person educated: Patient Education method:  Explanation, Demonstration, and Handouts Education comprehension: verbalized understanding and returned demonstration   HOME EXERCISE PROGRAM:  Access Code:  [email or web address] URL: [email or web address] Date: 11/20/2023 Prepared by: Venetia Endo  Exercises  - Supine Shoulder Flexion with Dowel  - 2 x daily - 7 x weekly - 2 sets - 10 reps - Supine Shoulder External Rotation with Dowel  - 2 x daily - 7 x weekly - 2 sets - 10 reps - Seated Scapular Retraction  - 2 x daily - 7 x weekly - 2 sets - 10 reps - 3sec hold  ASSESSMENT:  CLINICAL IMPRESSION: Pt fortunately has not had severe flare-up of pain since last f/u with PT, though he did have intermittent moderate pain largely affecting R>L shoulder and hip. Pt is undergoing further diagnostic workup for diffuse pain affecting several joints throughout body with most pain being experienced in hips and shoulders. Pt had negative testing for ANA, rheumatoid factor, and Lyme disease. He had some markers of inflammation per blood testing. Pt is awaiting radiology report for CXR. Pt tolerates session well today, and he is able to attain further shoulder elevation/abduction ROM and hip flexion/ER ROM following manual therapy. Pt will have to be out of PT for next month, so we will ensure he has established HEP to continue during time out of formal PT. We continued with posterior cuff strengthening and scapular control/serratus activation work as well as gluteal strengthening. Pt will continue to benefit from skilled PT services to address deficits and improve function.   OBJECTIVE IMPAIRMENTS: Abnormal gait, decreased mobility, difficulty walking, decreased ROM, decreased strength, hypomobility, impaired flexibility, impaired UE functional use, postural dysfunction, and pain.   ACTIVITY LIMITATIONS: carrying, lifting, bending, stairs, transfers, bed mobility, sleeping, and reach over head  PARTICIPATION LIMITATIONS: shopping, community activity, occupation, yard work, and fishing/boating participation  PERSONAL FACTORS: Past/current experiences, Profession, and 3+ comorbidities: (depression/anxiety,  HTN) are also affecting patient's functional outcome.   REHAB POTENTIAL: Good  CLINICAL DECISION MAKING: Unstable/unpredictable  EVALUATION COMPLEXITY: High   GOALS: Goals reviewed with patient? Yes  SHORT TERM GOALS: Target date: 12/12/2023  Pt will be independent with HEP to improve strength and decrease shoulder pain to improve pain-free function at home and work. Baseline: 11/20/23: Baseline HEP initiated for upper body; will further develop LE HEP at future f/u.  Goal status: INITIAL   LONG TERM GOALS: Target date: 01/17/2024  Pt will have shoulder forward flexion to 160 deg or greater bilaterally and functional ER/IR WFL without reproduction of pain as needed for overhead activity and functional reaching tasks Baseline: 11/20/23: Motion loss in forward elevation and ER.  Goal status: INITIAL  2.  Pt will decrease worst shoulder pain by at least 3 points on the NPRS in order to demonstrate clinically significant reduction in shoulder pain. Baseline: 11/20/23: 10/10 at worst.  Goal status: INITIAL  3.  Pt will decrease quick DASH score to no more than 20% in order to demonstrate clinically significant reduction in disability related to shoulder pain       Baseline: 11/20/23: 75% Goal status: INITIAL  4. Pt will increase strength of deltoid and posterior cuff mm to at least 4+/5 or greater MMT grade in order to demonstrate improvement in strength and function Baseline: 11/20/23: Shoulder flexion and ER 4-/5 with pain reproduction.  Goal status: INITIAL  5. Pt will improve strength of flexors, abductors, extensors to 4+/5 or greater MMT grade indicative of improved proximal/hip strength as needed for improved tolerance of load-bearing activities and  transferring Baseline: 11/22/23: Shoulder flexion and ER 4-/5 with pain reproduction.  Goal status: INITIAL  6. Pt will have pain-free sit to stand and stand-to-sit transfer without reproduction of pain as needed for household and community  mobility and getting up/down from office chair during workday.  Baseline: 11/22/23: Notable pain behaviors with transfers. Goal status: INITIAL  PLAN: PT FREQUENCY: 1-2x/week  PT DURATION: 8 weeks  PLANNED INTERVENTIONS: Therapeutic exercises, Therapeutic activity, Neuromuscular re-education, Balance training, Gait training, Patient/Family education, Self Care, Joint mobilization, Joint manipulation, Vestibular training, Canalith repositioning, Orthotic/Fit training, DME instructions, Dry Needling, Electrical stimulation, Spinal manipulation, Spinal mobilization, Cryotherapy, Moist heat, Taping, Traction, Ultrasound, Ionotophoresis 4mg /ml Dexamethasone, Manual therapy, and Re-evaluation.  PLAN FOR NEXT SESSION: Continue with manual techniques for GJH and scapular mobility. AAROM and periscapular isotonics as tolerated if pain levels are still high. Manual techniques/Mulligan mobilizations for hips dependent on pain. Update HEP next visit, advanced program for month of September.    Venetia Endo, PT, DPT #[id number]  Venetia ONEIDA Endo, PT 12/11/2023, 12:38 PM

## 2023-12-12 ENCOUNTER — Other Ambulatory Visit: Payer: Self-pay | Admitting: Physician Assistant

## 2023-12-12 MED ORDER — PREDNISONE 20 MG PO TABS: 20.0000 mg | ORAL_TABLET | Freq: Two times a day (BID) | ORAL | 0 refills | Status: AC

## 2023-12-12 NOTE — Telephone Encounter (Signed)
 Please review and advise patient.   JM

## 2023-12-13 ENCOUNTER — Ambulatory Visit: Admitting: Physical Therapy

## 2023-12-13 ENCOUNTER — Encounter: Payer: Self-pay | Admitting: Physical Therapy

## 2023-12-13 DIAGNOSIS — M25551 Pain in right hip: Secondary | ICD-10-CM

## 2023-12-13 DIAGNOSIS — M25511 Pain in right shoulder: Secondary | ICD-10-CM

## 2023-12-13 DIAGNOSIS — M6281 Muscle weakness (generalized): Secondary | ICD-10-CM

## 2023-12-13 DIAGNOSIS — R262 Difficulty in walking, not elsewhere classified: Secondary | ICD-10-CM

## 2023-12-13 NOTE — Therapy (Signed)
 OUTPATIENT PHYSICAL THERAPY TREATMENT  Patient Name: Javier FLY Sr. MRN: [id number] DOB:Jun 08, 1960, 63 y.o., male Today's Date: 12/13/2023  END OF SESSION:  PT End of Session - 12/13/23 0903     Visit Number 8    Number of Visits 17    Date for PT Re-Evaluation 01/15/24    Authorization Type Aetna 2025: VL 60 combined PT/OT/Speech    PT Start Time 0902    PT Stop Time 0945    PT Time Calculation (min) 43 min    Activity Tolerance Patient limited by pain    Behavior During Therapy [facility] for tasks assessed/performed           Past Medical History:  Diagnosis Date   Allergy 1960   Anxiety 1960   Asthma 1970   Basal cell carcinoma 03/07/2022   Right inf cheek - EDC   Dysplastic nevus 02/13/2019   Left low back, 11cm lat. to spine. Mild atypia, peripheral and deep margins involved.    GERD (gastroesophageal reflux disease) 1990   Hypertension 1980   Inactive TB 1999   Sarcoidosis 1997-2000   Sleep apnea 1990   Tuberculosis 1990   Inactive.   Past Surgical History:  Procedure Laterality Date   APPENDECTOMY  1993   COLONOSCOPY WITH PROPOFOL  N/A 04/01/2015   Procedure: COLONOSCOPY WITH PROPOFOL ;  Surgeon: Lamar ONEIDA Holmes, MD;  Location: [facility] ENDOSCOPY;  Service: Endoscopy;  Laterality: N/A;   EYE SURGERY  2010   Lasix   NASAL SEPTUM SURGERY  04/18/2007   pillar procedure  04/18/2007   TONSILLECTOMY     Patient Active Problem List   Diagnosis Date Noted   Polyarthralgia 12/07/2023   Allergy to alpha-gal 12/07/2023   Generalized osteoarthritis of multiple sites 10/29/2023   History of adenomatous polyp of colon 08/13/2023   Restless legs 08/13/2023   Basal cell carcinoma of skin 05/31/2023   Hypertensive disorder 05/31/2023   Lymphedema 05/05/2023   Lateral epicondylitis of right elbow 11/16/2022   Mild asthma 10/11/2020   History of iron deficiency 10/11/2020   GERD (gastroesophageal reflux disease) 07/13/2020   Benign essential HTN 07/13/2020    Sarcoidosis 07/13/2020   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   Sleep apnea 07/13/2020   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/2020   IBS (irritable bowel syndrome) 07/13/2020   Hypogonadism in male 07/13/2020   ED (erectile dysfunction) 07/13/2020   Depression with anxiety 07/13/2020   Chronic fatigue 07/13/2020   Decreased testosterone level 05/19/2020    PCP: Toribio SHAUNNA Hoyle, PA  REFERRING PROVIDER: Toribio SHAUNNA Hoyle, PA  REFERRING DIAG: M15.9 (ICD-10-CM) - Generalized osteoarthritis of multiple sites   RATIONALE FOR EVALUATION AND TREATMENT: Rehabilitation  THERAPY DIAG: Bilateral shoulder pain, unspecified chronicity  Bilateral hip pain  Muscle weakness (generalized)  Difficulty in walking, not elsewhere classified  ONSET DATE: 3 months ago  FOLLOW-UP APPT SCHEDULED WITH REFERRING PROVIDER: None for this condition scheduled soon   PERTINENT HISTORY: Pt is a 63 year old male referred from PCP for osteoarthritis of multiple joints; he has Hx of groin pain beginning on R side than hurting L. Then, he had R shoulder pain with eventual progression to L shoulder pain. Pt reports primarily L>R shoulder pain this AM. No numbness/tingling.   Pain began with groin pain with each step. He had R shoulder pain starting after that. He had prednisone  taper that made it better. Patient reports that pain progressed into involving bilateral shoulders and bilateral groin region. Pt reports this past Friday, he started having pain along base  of his neck and was having severe pain this past weekend. He reports more popping in his R shoulder over this past week. Pt reports taking magnesium seems to have helped; condition is better with taking this and resting. Pt reports sarcoidosis diagnosis 25 years ago that led to multi-site joint pain. Pt reports 20 years since significant flare-up of this condition. 50 out of 10 pain when it's flared up.   Pt reports notable pain with attempting to lift his R arm.   PAIN:  Pain  Intensity: Present: 3/10, Best: 0-1/10 (on prednisone ), Worst: 10/10 Pain location: L deltoid > R deltoid/ACJ region; R and L groin/medial thigh pain Pain Quality: aching , intermittent sharp pain with specific motion  Radiating: No  Numbness/Tingling: No N//T Focal Weakness: No Aggravating factors: transferring from bed/chair/couch, rolling in bed, walking, sitting Relieving factors: prednisone , magnesium supplement 24-hour pain behavior: No specific time of day  History of prior shoulder or neck/shoulder injury, pain, surgery, or therapy: No Falls: Has patient fallen in last 6 months? No, Number of falls: N/A Dominant hand: right  Imaging: Yes ;  CLINICAL DATA:  atraumatic right shoulder pain x3wk  EXAM: RIGHT SHOULDER - 2+ VIEW  COMPARISON:  None Available.  FINDINGS: No acute fracture or dislocation. Moderate AC joint space loss. Soft tissues are unremarkable. ...     Narrative & Impression  CLINICAL DATA:  atraumatic right hip/groin pain x3 weeks, painful gait   EXAM: DG HIP (WITH OR WITHOUT PELVIS) 2-3V RIGHT   COMPARISON:  None Available.   FINDINGS: There is no evidence of hip fracture or dislocation of the right hip. No acute displaced fracture left hip on frontal view. No acute displaced fracture or diastasis of the bones of the pelvis. There is no evidence of arthropathy or other focal bone abnormality.   Right lower lobe vascular clips and likely surgical staples.   IMPRESSION: Negative for acute traumatic injury.    Red flags (personal history of cancer, chills/fever, night sweats, nausea, vomiting, unrelenting pain, unexplained weight gain/loss): Positive for skin cancer; removed several months ago   PRECAUTIONS: None  WEIGHT BEARING RESTRICTIONS: No  FALLS: Has patient fallen in last 6 months? 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)  Hobbies: fishing, boating   Patient Goals: Reduce pain, able to go fishing     OBJECTIVE (data from initial evaluation unless otherwise dated):   Patient Surveys  QuickDASH: 75%  Posture Rounded shoulders, inc thoracic kyphosis, forward head  Gait Shortened unipedal stance time bilat, forward flexed posture worse with first few steps  Cervical Screen AROM: see below Spurlings A (ipsilateral lateral flexion/axial compression): R: Negative (pain L UT with R lateral flexion) L: Negative Distraction Test: Negative Hoffman Sign (cervical cord compression): R: Negative L: Negative ULTTs deferred  AROM AROM (Normal range in degrees) AROM 11/20/23  Cervical  Flexion (50) WNL  Extension (80) 30* (pain low back with extension)  Right lateral flexion (45) WNL  Left lateral flexion (45) WNL* (ipsilateral pain affecting medial L UT)   Right rotation (85) WNL  Left rotation (85) WNL* (pain L UT)  Right Left  Shoulder    Flexion 145 158  Extension    Abduction 170 172  External Rotation (arm at side) 56 60  Internal Rotation [facility] WFL  Hands Behind Head    Hands Behind Back        Elbow    Flexion WNL WNL  Extension WNL WNL  Pronation    Supination    (* = pain; Blank rows = not tested)  UE MMT: MMT (out of 5) Right 11/20/23 Left 11/20/23      Shoulder   Flexion 4-* 4-*  Extension    Abduction 4+ 5-  External rotation 4-* 4-  Internal rotation 5 5  Horizontal abduction    Horizontal adduction    Lower Trapezius    Rhomboids        Elbow  Flexion 5 5  Extension 5 5  Pronation    Supination        (* = pain; Blank rows = not tested)  Sensation Negative  for sensory deficit UE/LE   Reflexes Deferred  Palpation Location LEFT  RIGHT           Subocciptials    Cervical paraspinals    Upper Trapezius 0 0  Levator Scapulae    Rhomboid Major/Minor    Sternoclavicular joint    Acromioclavicular joint 0 0  Coracoid process 0 0  Long head of biceps 0 0  Supraspinatus 0 0  Infraspinatus 0 0  Subscapularis    Teres Minor 0 0  Teres Major    Pectoralis Major 0 0  Pectoralis Minor    Anterior Deltoid 0 0  Lateral Deltoid 0 0  Posterior Deltoid 0 0  Latissimus Dorsi    Sternocleidomastoid    (Blank rows = not tested) Graded on 0-4 scale (0 = no pain, 1 = pain, 2 = pain with wincing/grimacing/flinching, 3 = pain with withdrawal, 4 = unwilling to allow palpation), (Blank rows = not tested)   SPECIAL TESTS Rotator Cuff  Drop Arm Test: Negative Painful Arc (Pain from 60 to 120 degrees scaption): Negative Infraspinatus Muscle Test: Positive If all 3 tests positive, the probability of a full-thickness rotator cuff tear is 91%  Subacromial Impingement Hawkins-Kennedy: Not examined Neer (Block scapula, PROM flexion): Negative Painful Arc (Pain from 60 to 120 degrees scaption): Negative Empty Can: Negative External Rotation Resistance: Positive Horizontal Adduction: Not examined Scapular Assist: Positive (11/22/23)    Bicep Tendon Pathology Speed (shoulder flexion to 90, external rotation, full elbow extension, and forearm supination with resistance: Positive Yergason's (resisted shoulder ER and supination/biceps tendon pathology): R Negative, L Positive (mild pain)  ___________________________________  LOWER QUARTER EXAM 11/22/23   AROM AROM (Normal range in degrees) AROM  11/22/23  Lumbar   Flexion (65) WNL  Extension (30) WNL  Right lateral flexion (25) WNL  Left lateral flexion (25) WNL  Right rotation (30) 75%* (mild R groin)  Left rotation (30) 75%* (mild R groin)      Hip Right Left  Flexion (125) 120* 120*  Extension  (15)    Abduction (40) 40* 40  Adduction     Internal Rotation (45) 15 15  External Rotation (45) 40* 40*      (* = pain; Blank rows = not tested)  LE MMT: MMT (out of 5) Right 11/22/23 Left 11/22/23  Hip flexion 4* 4*  Hip extension 4* 4*  Hip abduction 4-* 4-*  Hip adduction    Hip internal rotation    Hip  external rotation    Knee flexion 5 5  Knee extension 5 5  Ankle dorsiflexion    Ankle plantarflexion    Ankle inversion    Ankle eversion    (* = pain; Blank rows = not tested)  Sensation Deferred  Reflexes Deferred  Muscle Length Hamstrings: R: Negative L: Negative Ely (quadriceps): R: Positive L: Positive (no back pain)   Palpation Location Right Left         Lumbar paraspinals    Quadratus Lumborum    Gluteus Maximus 0 0  Gluteus Medius 0 0  Deep hip external rotators    PSIS    Fortin's Area (SIJ)    Greater Trochanter 0 0  Distal iliopsoas 1 1  (Blank rows = not tested) Graded on 0-4 scale (0 = no pain, 1 = pain, 2 = pain with wincing/grimacing/flinching, 3 = pain with withdrawal, 4 = unwilling to allow palpation)  Passive Accessory Intervertebral Motion Deferred  Special Tests Lumbar Radiculopathy and Discogenic: Slump (SN 83, -LR 0.32): R: Not examined L: Not examined SLR (SN 92, -LR 0.29): R: Negative L:  Negative Crossed SLR (SP 90): R: Negative L: Negative Prone Knee Bend: R: Negative L: Negative  Facet Joint: Extension-Rotation (SN 100, -LR 0.0): R: Negative L: Negative  Lumbar Foraminal Stenosis: Lumbar quadrant (SN 70): R: Negative L: Negative  Hip: FABER (SN 81): R: Positive L: Positive FADIR (SN 94): R: Not examined L: Not examined Hip scour (SN 50): R: Positive L: Positive  Piriformis Syndrome: PACE sign: Negative     TODAY'S TREATMENT   12/13/2023   SUBJECTIVE STATEMENT:   Patient reports pain in upper cervical region. Patient reports pain and stiffness in shoulders. Patient reports terrible day yesterday. He reports  notable pain Tuesday night. Pt reports using Celebrex  with very little relief. Pt reports no notable lower body symptoms at arrival. 2/10 NPRS today. [place] over the next month throughout weekdays and opts to hold on formal PT during that time - he agrees with one-time f/u in early October.      Manual Therapy - for symptom modulation, joint mobility, ROM   Supine: R shoulder shoulder glenohumeral distraction, grade III; 2 x 30 sec bouts Glenohumeral mobilization: R shoulder today, posterior and inferior glides grade III; 3 x 30 sec per direction ______________  Arliss belt lateral distraction 2 x 30-sec bouts, R side today  MWM R hip; lateral distraction with hip flexion and ER as tolerated; x 10 reps per direction  -improved tolerance of flexion and ER    Therapeutic Exercise - for improved soft tissue flexibility and extensibility as needed for ROM, improved strength as needed to improve performance of CKC activities/functional movements     Bridge, with Blue Tband hip abduction isometric; 2 x 10 Sidelying hip abduction; 2 x 10  ___________  Supine wand ER AAROM; x20 on R and L UE Standing shoulder adduction with Red Tband (linked to top hook); abduction assist; 2 x 10 on either side Pulleys, forward elevation; x 2 min   PATIENT EDUCATION: Discussed current condition and continued diagnostic workup with referring provider, updated HEP and provided new handouts and pulleys for home.   NOT TODAY Quadruped CARs (articular rotations/circles); x 10 on either side Serratus slide with Red band and foam roll; 2 x 8 Supine butterfly stretch; 3 x 30 sec  Bilat ER with scap retraction, back to wall; Green Tband; 2 x 10 Supine Shoulder Flexion with Dowel,  1x8  PATIENT EDUCATION:  Education details: see above for patient education details Person educated: Patient Education method: Explanation, Demonstration, and Handouts Education comprehension: verbalized  understanding and returned demonstration   HOME EXERCISE PROGRAM:  Access Code: [email or web address] URL: [email or web address] Date: 12/13/2023 Prepared by: Venetia Endo  Exercises - Supine Shoulder Flexion with Dowel  - 2 x daily - 7 x weekly - 2 sets - 10 reps - Supine Shoulder External Rotation with Dowel  - 2 x daily - 7 x weekly - 2 sets - 10 reps - Shoulder External Rotation and Scapular Retraction with Resistance  - 1 x daily - 4 x weekly - 2 sets - 10 reps - Seated Shoulder Flexion AAROM with Pulley Behind  - 2 x daily - 7 x weekly - 2 sets - 10 reps - Supine Butterfly Groin Stretch  - 2 x daily - 7 x weekly - 3 sets - 30sec hold - Half Kneeling Hip Flexor Stretch  - 2 x daily - 7 x weekly - 3 sets - 30sec hold - Sidelying Hip Abduction  - 1 x daily - 4 x weekly - 2 sets - 10 reps - Supine Bridge with Resistance Band  - 1 x daily - 4 x weekly - 2 sets - 10 reps  ASSESSMENT:  CLINICAL IMPRESSION: Patient's symptoms have been highly variable and diffuse with several upper and lower quarter joints affected at various times. Pt has severe debilitating pain and intervals of minimal to no pain. Pt has had negative testing for rheumatoid factor, Lyme disease, and ANA. Pt is awaiting results from radiology regarding recent CXR given Hx of sarcoidosis. HEP was updated today given pt being out of town over the next month; one-time follow-up in October is planned for re-assessment.  Pt will continue to benefit from skilled PT services to address deficits and improve function.   OBJECTIVE IMPAIRMENTS: Abnormal gait, decreased mobility, difficulty walking, decreased ROM, decreased strength, hypomobility, impaired flexibility, impaired UE functional use, postural dysfunction, and pain.   ACTIVITY LIMITATIONS: carrying, lifting, bending, stairs, transfers, bed mobility, sleeping, and reach over head  PARTICIPATION LIMITATIONS: shopping, community activity, occupation, yard work, and  fishing/boating participation  PERSONAL FACTORS: Past/current experiences, Profession, and 3+ comorbidities: (depression/anxiety, HTN) are also affecting patient's functional outcome.   REHAB POTENTIAL: Good  CLINICAL DECISION MAKING: Unstable/unpredictable  EVALUATION COMPLEXITY: High   GOALS: Goals reviewed with patient? Yes  SHORT TERM GOALS: Target date: 12/12/2023  Pt will be independent with HEP to improve strength and decrease shoulder pain to improve pain-free function at home and work. Baseline: 11/20/23: Baseline HEP initiated for upper body; will further develop LE HEP at future f/u.  Goal status: INITIAL   LONG TERM GOALS: Target date: 01/17/2024  Pt will have shoulder forward flexion to 160 deg or greater bilaterally and functional ER/IR WFL without reproduction of pain as needed for overhead activity and functional reaching tasks Baseline: 11/20/23: Motion loss in forward elevation and ER.  Goal status: INITIAL  2.  Pt will decrease worst shoulder pain by at least 3 points on the NPRS in order to demonstrate clinically significant reduction in shoulder pain. Baseline: 11/20/23: 10/10 at worst.  Goal status: INITIAL  3.  Pt will decrease quick DASH score to no more than 20% in order to demonstrate clinically significant reduction in disability related to shoulder pain       Baseline: 11/20/23: 75% Goal status: INITIAL  4. Pt will increase strength of deltoid and posterior cuff mm to  at least 4+/5 or greater MMT grade in order to demonstrate improvement in strength and function Baseline: 11/20/23: Shoulder flexion and ER 4-/5 with pain reproduction.  Goal status: INITIAL  5. Pt will improve strength of flexors, abductors, extensors to 4+/5 or greater MMT grade indicative of improved proximal/hip strength as needed for improved tolerance of load-bearing activities and transferring Baseline: 11/22/23: Shoulder flexion and ER 4-/5 with pain reproduction.  Goal status:  INITIAL  6. Pt will have pain-free sit to stand and stand-to-sit transfer without reproduction of pain as needed for household and community mobility and getting up/down from office chair during workday.  Baseline: 11/22/23: Notable pain behaviors with transfers. Goal status: INITIAL  PLAN: PT FREQUENCY: 1-2x/week  PT DURATION: 8 weeks  PLANNED INTERVENTIONS: Therapeutic exercises, Therapeutic activity, Neuromuscular re-education, Balance training, Gait training, Patient/Family education, Self Care, Joint mobilization, Joint manipulation, Vestibular training, Canalith repositioning, Orthotic/Fit training, DME instructions, Dry Needling, Electrical stimulation, Spinal manipulation, Spinal mobilization, Cryotherapy, Moist heat, Taping, Traction, Ultrasound, Ionotophoresis 4mg /ml Dexamethasone, Manual therapy, and Re-evaluation.  PLAN FOR NEXT SESSION: Continue with manual techniques for GJH and scapular mobility. AAROM and periscapular isotonics as tolerated if pain levels are still high. Manual techniques/Mulligan mobilizations for hips dependent on pain. One-time follow-up planned once pt is back from his beach home in early October.    Venetia Endo, PT, DPT #[id number]  Venetia ONEIDA Endo, PT 12/13/2023, 9:04 AM

## 2023-12-19 ENCOUNTER — Ambulatory Visit: Admitting: Physical Therapy

## 2023-12-24 ENCOUNTER — Ambulatory Visit: Admitting: Physical Therapy

## 2024-01-16 ENCOUNTER — Ambulatory Visit: Admitting: Physician Assistant

## 2024-01-24 ENCOUNTER — Ambulatory Visit: Admitting: Physical Therapy

## 2024-02-04 ENCOUNTER — Other Ambulatory Visit: Payer: Self-pay | Admitting: Physician Assistant

## 2024-02-04 DIAGNOSIS — K219 Gastro-esophageal reflux disease without esophagitis: Secondary | ICD-10-CM

## 2024-02-05 ENCOUNTER — Other Ambulatory Visit: Payer: Self-pay | Admitting: Physician Assistant

## 2024-02-05 DIAGNOSIS — K580 Irritable bowel syndrome with diarrhea: Secondary | ICD-10-CM

## 2024-02-05 NOTE — Telephone Encounter (Signed)
 Requested Prescriptions  Pending Prescriptions Disp Refills   pantoprazole  (PROTONIX ) 40 MG tablet [Pharmacy Med Name: pantoprazole  40 mg tablet,delayed release] 90 tablet 1    Sig: TAKE ONE TABLET BY MOUTH ONCE DAILY     Gastroenterology: Proton Pump Inhibitors Passed - 02/05/2024  3:35 PM      Passed - Valid encounter within last 12 months    Recent Outpatient Visits           2 months ago Sarcoidosis   [facility] at [facility], Toribio SQUIBB, [place]   3 months ago Irritable bowel syndrome with diarrhea   [facility] at [facility], Toribio SQUIBB, [place]   5 months ago Irritable bowel syndrome with diarrhea   [facility] Care & Sports Medicine at [facility], Toribio SQUIBB, [place]       Future Appointments             In 1 month Hester Alm BROCKS, MD [facility]

## 2024-02-06 ENCOUNTER — Other Ambulatory Visit: Payer: Self-pay | Admitting: Physician Assistant

## 2024-02-06 DIAGNOSIS — K648 Other hemorrhoids: Secondary | ICD-10-CM

## 2024-02-07 NOTE — Telephone Encounter (Signed)
 Requested medications are due for refill today.  yes  Requested medications are on the active medications list.  yes  Last refill. 10/15/2023 #12 0 rf  Future visit scheduled.   no  Notes to clinic.  Medication not assigned to a protocol. Please review for refill.    Requested Prescriptions  Pending Prescriptions Disp Refills   hydrocortisone  (ANUSOL -HC) 25 MG suppository [Pharmacy Med Name: hydrocortisone  acetate 25 mg rectal suppository] 12 suppository 0    Sig: unwrap AND INSERT ONE SUPPOSITORY RECTALLY TWICE DAILY     Off-Protocol Failed - 02/07/2024  4:54 PM      Failed - Medication not assigned to a protocol, review manually.      Passed - Valid encounter within last 12 months    Recent Outpatient Visits           2 months ago Sarcoidosis   [facility] Medicine at [facility], Toribio SQUIBB, [place]   3 months ago Irritable bowel syndrome with diarrhea   [facility] Primary Care & [facility] at [facility], Toribio SQUIBB, [place]   5 months ago Irritable bowel syndrome with diarrhea   [facility] & [facility] Medicine at [facility], Toribio SQUIBB, [place]       Future Appointments             In 1 month Hester Alm BROCKS, MD [facility]

## 2024-02-07 NOTE — Telephone Encounter (Signed)
 Requested medication (s) are due for refill today: yes  Requested medication (s) are on the active medication list: not valtrex  Last refill:  09/25/23 and 08/13/23  Future visit scheduled: yes  Notes to clinic:  Valacyclovir is historical medication. Routing for review of Sig for Bentyl .     Requested Prescriptions  Pending Prescriptions Disp Refills   dicyclomine  (BENTYL ) 20 MG tablet [Pharmacy Med Name: dicyclomine  20 mg tablet] 90 tablet 1    Sig: TAKE ONE TABLET BY MOUTH EVERY 6 HOURS as needed for IBS     Gastroenterology:  Antispasmodic Agents Passed - 02/07/2024  9:09 AM      Passed - Valid encounter within last 12 months    Recent Outpatient Visits           2 months ago Sarcoidosis   [facility] Primary Care & Sports Medicine at [facility], Toribio SQUIBB, [place]   3 months ago Irritable bowel syndrome with diarrhea   [facility] Care & Sports Medicine at [facility], [place]ibio SQUIB[place], [place]   5 months ago Irritable bowel syndrome with diarrhea   [facility] Primary Care & Sports Medicine at [facility], Toribio SQUIBB, [place]       Future Appointments             In 1 month Hester Alm BROCKS, MD [facility]             valACYclovir (VALTREX) 1000 MG tablet [Pharmacy Med Name: valacyclovir 1 gram tablet] 10 tablet 1    Sig: TAKE ONE TABLET BY MOUTH ONCE DAILY     Antimicrobials:  Antiviral Agents - Anti-Herpetic Passed - 02/07/2024  9:09 AM      Passed - Valid encounter within last 12 months    Recent Outpatient Visits           2 months ago Sarcoidosis   [facility] Primary Care & Sports Medicine at [facility], Toribio SQUIBB, [place]   3 months ago Irritable bowel syndrome with diarrhea   [facility] Primary Care & Sports Medicine at [facility], [place]ib[place]QU[place], [place]   5 months ago Irritable bowel syndrome with diarrhea   [facility] & [facility] Medicine at [facility], Toribio SQUIBB, [place]        Future Appointments             In 1 month Hester Alm BROCKS, MD [facility]

## 2024-02-07 NOTE — Telephone Encounter (Signed)
 Please review.  KP

## 2024-02-11 ENCOUNTER — Other Ambulatory Visit: Payer: Self-pay | Admitting: Physician Assistant

## 2024-02-11 DIAGNOSIS — M159 Polyosteoarthritis, unspecified: Secondary | ICD-10-CM

## 2024-02-11 NOTE — Telephone Encounter (Signed)
 Please review.  KP

## 2024-02-12 NOTE — Telephone Encounter (Signed)
 Requested medication (s) are due for refill today: Yes  Requested medication (s) are on the active medication list: Yes  Last refill:  12/07/23  Future visit scheduled: Yes  Notes to clinic:  Manual review.    Requested Prescriptions  Pending Prescriptions Disp Refills   celecoxib  (CELEBREX ) 200 MG capsule [Pharmacy Med Name: celecoxib  200 mg capsule] 30 capsule 1    Sig: TAKE 1 CAPSULE BY MOUTH ONCE DAILY as needed for joint pain. Take WITH FOOD. DO not take with other nsaids.     Analgesics:  COX2 Inhibitors Failed - 02/12/2024  1:55 PM      Failed - Manual Review: Labs are only required if the patient has taken medication for more than 8 weeks.      Passed - HGB in normal range and within 360 days    Hemoglobin  Date Value Ref Range Status  12/07/2023 15.1 13.0 - 17.7 g/dL Final         Passed - Cr in normal range and within 360 days    Creatinine, Ser  Date Value Ref Range Status  12/07/2023 1.19 0.76 - 1.27 mg/dL Final         Passed - HCT in normal range and within 360 days    Hematocrit  Date Value Ref Range Status  12/07/2023 47.2 37.5 - 51.0 % Final         Passed - AST in normal range and within 360 days    AST  Date Value Ref Range Status  12/07/2023 23 0 - 40 IU/L Final         Passed - ALT in normal range and within 360 days    ALT  Date Value Ref Range Status  12/07/2023 23 0 - 44 IU/L Final         Passed - eGFR is 30 or above and within 360 days    eGFR  Date Value Ref Range Status  12/07/2023 68 >59 mL/min/1.73 Final         Passed - Patient is not pregnant      Passed - Valid encounter within last 12 months    Recent Outpatient Visits           2 months ago Sarcoidosis   [facility] at [facility], [place]   4 months ago Irritable bowel syndrome with diarrhea   [facility] at [facility], [place]   6 months ago Irritable bowel syndrome  with diarrhea   [facility] at [facility], [place], [place]       Future Appointments             In 1 month Hester Alm BROCKS, MD [facility]

## 2024-02-18 ENCOUNTER — Other Ambulatory Visit: Payer: Self-pay | Admitting: Physician Assistant

## 2024-02-19 NOTE — Telephone Encounter (Signed)
 Requested Prescriptions  Pending Prescriptions Disp Refills   lisinopril (ZESTRIL) 20 MG tablet [Pharmacy Med Name: lisinopril 20 mg tablet] 90 tablet 1    Sig: TAKE ONE TABLET BY MOUTH ONCE DAILY     Cardiovascular:  ACE Inhibitors Passed - 02/19/2024  3:20 PM      Passed - Cr in normal range and within 180 days    Creatinine, Ser  Date Value Ref Range Status  12/07/2023 1.19 0.76 - 1.27 mg/dL Final         Passed - K in normal range and within 180 days    Potassium  Date Value Ref Range Status  12/07/2023 4.0 3.5 - 5.2 mmol/L Final         Passed - Patient is not pregnant      Passed - Last BP in normal range    BP Readings from Last 1 Encounters:  12/07/23 138/80         Passed - Valid encounter within last 6 months    Recent Outpatient Visits           2 months ago Sarcoidosis   [place] [facility] at [facility], [place]   4 months ago Irritable bowel syndrome with diarrhea   [facility] at [facility], [place]   6 months ago Irritable bowel syndrome with diarrhea   [facility] Medicine at [facility], [place], [place]       Future Appointments             In 1 month Javier Alm BROCKS, MD [facility]             buPROPion (WELLBUTRIN XL) 300 MG 24 hr tablet [Pharmacy Med Name: bupropion HCl XL 300 mg 24 hr tablet, extended release] 90 tablet 1    Sig: take 1 tablet by mouth every morning     Psychiatry: Antidepressants - bupropion Passed - 02/19/2024  3:20 PM      Passed - Cr in normal range and within 360 days    Creatinine, Ser  Date Value Ref Range Status  12/07/2023 1.19 0.76 - 1.27 mg/dL Final         Passed - AST in normal range and within 360 days    AST  Date Value Ref Range Status  12/07/2023 23 0 - 40 IU/L Final         Passed - ALT in normal range and within 360 days    ALT  Date Value Ref Range  Status  12/07/2023 23 0 - 44 IU/L Final         Passed - Completed PHQ-2 or PHQ-9 in the last 360 days      Passed - Last BP in normal range    BP Readings from Last 1 Encounters:  12/07/23 138/80         Passed - Valid encounter within last 6 months    Recent Outpatient Visits           2 months ago Sarcoidosis   [facility] at [facility], [place]   4 months ago Irritable bowel syndrome with diarrhea   [facility] at [facility], [place]   6 months ago Irritable bowel syndrome with diarrhea   [facility] at [facility], [place]       Future Appointments             In 1 month Javier Alm BROCKS, MD [facility]

## 2024-03-18 ENCOUNTER — Encounter (INDEPENDENT_AMBULATORY_CARE_PROVIDER_SITE_OTHER): Payer: Self-pay

## 2024-03-19 ENCOUNTER — Other Ambulatory Visit: Payer: Self-pay | Admitting: Medical Genetics

## 2024-03-25 ENCOUNTER — Ambulatory Visit: Admitting: Physician Assistant

## 2024-03-31 ENCOUNTER — Encounter: Payer: Self-pay | Admitting: Physician Assistant

## 2024-03-31 ENCOUNTER — Ambulatory Visit: Admitting: Physician Assistant

## 2024-03-31 VITALS — BP 124/78 | HR 69 | Temp 97.8°F | Ht 71.0 in | Wt 225.0 lb

## 2024-03-31 DIAGNOSIS — M0609 Rheumatoid arthritis without rheumatoid factor, multiple sites: Secondary | ICD-10-CM | POA: Diagnosis not present

## 2024-03-31 DIAGNOSIS — Z1589 Genetic susceptibility to other disease: Secondary | ICD-10-CM | POA: Insufficient documentation

## 2024-03-31 NOTE — Assessment & Plan Note (Signed)
 Seems to be doing better with this since seeing rheumatology.  Continue specialty follow-up.

## 2024-03-31 NOTE — Progress Notes (Signed)
 Date:  03/31/2024   Name:  Javier POSTLEWAITE Sr.   DOB:  March 19, 1961   MRN:  [id number]   Chief Complaint: Arthritis  HPI  Tim returns for follow up on episodic polyarthralgias.  Since our last visit, he has established with rheumatology Dr. Tobie and seems to get along with him.  Per the last rheumatology note on 03/11/2024, it seems Dr. 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  This is presently managed with methotrexate once weekly and meloxicam as needed.  He did have recent positive HLA-B27.  He also recently saw urology who manages his hypogonadism, recent placement of testosterone pellets 03/18/24.  Tim does not need anything in particular from me today.  Medication list has been reviewed and updated.  Active Medications[1]   Review of Systems  Patient Active Problem List   Diagnosis Date Noted   Rheumatoid arthritis of multiple sites with negative rheumatoid factor (HCC) 03/30/2024   HLA B27 positive 03/30/2024   Polyarthralgia 12/07/2023   Allergy to alpha-gal 12/07/2023   Generalized osteoarthritis of multiple sites 10/29/2023   History of adenomatous polyp of colon 08/13/2023   Restless legs 08/13/2023   Basal cell carcinoma of skin 05/31/2023   Hypertensive disorder 05/31/2023   Lymphedema 05/05/2023   Lateral epicondylitis of right elbow 11/16/2022   Mild asthma 10/11/2020   History of iron deficiency 10/11/2020   GERD (gastroesophageal reflux disease) 07/13/2020   Benign essential HTN 07/13/2020   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   Sleep apnea 07/13/2020   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/2020   IBS (irritable bowel syndrome) 07/13/2020   Hypogonadism in male 07/13/2020   ED (erectile dysfunction) 07/13/2020   Depression with anxiety 07/13/2020   Chronic fatigue 07/13/2020   Decreased testosterone level 05/19/2020    Allergies[2]  Immunization History  Administered  Date(s) Administered   Hepatitis A, Adult 05/25/2014, 12/23/2014   Influenza-Unspecified 02/16/2019, 02/16/2020   PFIZER(Purple Top)SARS-COV-2 Vaccination 07/04/2019, 07/24/2019, 04/01/2020   PNEUMOCOCCAL CONJUGATE-20 08/23/2020   Tdap 06/04/2018, 06/06/2023   Zoster Recombinant(Shingrix) 03/20/2018, 05/22/2018    Past Surgical History:  Procedure Laterality Date   APPENDECTOMY  1993   COLONOSCOPY WITH PROPOFOL  N/A 04/01/2015   Procedure: COLONOSCOPY WITH PROPOFOL ;  Surgeon: Lamar ONEIDA Holmes, MD;  Location: [facility] ENDOSCOPY;  Service: Endoscopy;  Laterality: N/A;   EYE SURGERY  2010   Lasix   NASAL SEPTUM SURGERY  04/18/2007   pillar procedure  04/18/2007   TONSILLECTOMY      Social History[3]  Family History  Problem Relation Age of Onset   Hypertension Mother    Stroke Mother    Hearing loss Mother    Miscarriages / Stillbirths Mother    Hypertension Father    Colon cancer Father    Alcohol abuse Father    Cancer Father    Stroke Father    Arthritis Brother    Hypertension Brother    Vision loss Brother         03/31/2024    8:30 AM 12/07/2023    1:21 PM 10/15/2023    8:41 AM 08/13/2023    2:25 PM  GAD 7 : Generalized Anxiety Score  Nervous, Anxious, on Edge 2 2 3 3   Control/stop worrying 2 0 3 3  Worry too much - different things 3 3 3 3   Trouble relaxing 2 2 3 3   Restless 0 0 3 3  Easily annoyed or irritable 0 0 3 3  Afraid - awful might happen 0 0 1 0  Total GAD 7 Score 9 7 19 18   Anxiety Difficulty Somewhat difficult Somewhat difficult Extremely difficult Extremely difficult       03/31/2024    8:30 AM 12/07/2023    1:20 PM 10/15/2023    8:41 AM  Depression screen PHQ 2/9  Decreased Interest 0 2 2  Down, Depressed, Hopeless 0 0 2  PHQ - 2 Score 0 2 4  Altered sleeping 3 3 0  Tired, decreased energy 3 3 3   Change in appetite 2 0 2  Feeling bad or failure about yourself  0 0 0  Trouble concentrating 0 1 3  Moving slowly or fidgety/restless 0 3 3   Suicidal thoughts 0 0 0  PHQ-9 Score 8 12  15    Difficult doing work/chores Not difficult at all Extremely dIfficult Somewhat difficult     Data saved with a previous flowsheet row definition    BP Readings from Last 3 Encounters:  03/31/24 124/78  12/07/23 138/80  11/05/23 138/79    Wt Readings from Last 3 Encounters:  03/31/24 225 lb (102.1 kg)  12/07/23 223 lb (101.2 kg)  11/05/23 225 lb 3.2 oz (102.2 kg)    BP 124/78   Pulse 69   Temp 97.8 F (36.6 C)   Ht 5' 11 (1.803 m)   Wt 225 lb (102.1 kg)   SpO2 98%   BMI 31.38 kg/m   Physical Exam Vitals and nursing note reviewed.  Constitutional:      Appearance: Normal appearance.  Cardiovascular:     Rate and Rhythm: Normal rate and regular rhythm.     Heart sounds: No murmur heard.    No friction rub. No gallop.  Pulmonary:     Effort: Pulmonary effort is normal.     Breath sounds: Normal breath sounds.  Abdominal:     General: There is no distension.  Musculoskeletal:        General: Normal range of motion.  Skin:    General: Skin is warm and dry.  Neurological:     Mental Status: He is alert and oriented to person, place, and time.     Gait: Gait is intact.  Psychiatric:        Mood and Affect: Mood and affect normal.     Recent Labs     Component Value Date/Time   NA 138 12/07/2023 1421   K 4.0 12/07/2023 1421   CL 99 12/07/2023 1421   CO2 24 12/07/2023 1421   GLUCOSE 77 12/07/2023 1421   BUN 17 12/07/2023 1421   CREATININE 1.19 12/07/2023 1421   CALCIUM 9.6 12/07/2023 1421   PROT 7.5 12/07/2023 1421   ALBUMIN 4.5 12/07/2023 1421   AST 23 12/07/2023 1421   ALT 23 12/07/2023 1421   ALKPHOS 78 12/07/2023 1421   BILITOT 0.4 12/07/2023 1421    Lab Results  Component Value Date   WBC 8.0 12/07/2023   HGB 15.1 12/07/2023   HCT 47.2 12/07/2023   MCV 96 12/07/2023   PLT 336 12/07/2023   No results found for: HGBA1C No results found for: CHOL, HDL, LDLCALC, LDLDIRECT, TRIG,  CHOLHDL No results found for: TSH    Assessment and Plan:  Rheumatoid arthritis of multiple sites with negative rheumatoid factor (HCC) Assessment & Plan: Seems to be doing better with this since seeing rheumatology.  Continue specialty follow-up.      Follow-up 4 months CPE   Rolan Hoyle, PA-C, DMSc,  Nutritionist [place] [facility] [phone]      [1]  Current Meds  Medication Sig   albuterol (PROVENTIL HFA;VENTOLIN HFA) 108 (90 BASE) MCG/ACT inhaler Inhale into the lungs every 6 (six) hours as needed for wheezing or shortness of breath.   Black Elderberry (SAMBUCUS ELDERBERRY) 50 MG/5ML SYRP Take by mouth.   buPROPion (WELLBUTRIN XL) 300 MG 24 hr tablet take 1 tablet by mouth every morning   chlorthalidone (HYGROTON) 25 MG tablet take 1 tablet by mouth every morning   dicyclomine  (BENTYL ) 20 MG tablet TAKE ONE TABLET BY MOUTH EVERY 6 HOURS as needed for ibs   finasteride  (PROPECIA ) 1 MG tablet Take 1 tablet (1 mg total) by mouth daily.   folic acid (FOLVITE) 1 MG tablet Take 1 mg by mouth daily.   furosemide (LASIX) 20 MG tablet Take 20 mg by mouth as needed for edema or fluid.   hydrocortisone  (ANUSOL -HC) 25 MG suppository unwrap AND INSERT ONE SUPPOSITORY RECTALLY TWICE DAILY   lisinopril (ZESTRIL) 20 MG tablet TAKE ONE TABLET BY MOUTH ONCE DAILY   loperamide  (IMODIUM  A-D) 2 MG tablet Take 1 tablet (2 mg total) by mouth 2 (two) times daily as needed for diarrhea or loose stools.   MAGNESIUM PO Take by mouth.   meloxicam (MOBIC) 15 MG tablet Take 15 mg by mouth.   methotrexate (RHEUMATREX) 2.5 MG tablet Take 20 mg by mouth.   pantoprazole  (PROTONIX ) 40 MG tablet TAKE ONE TABLET BY MOUTH ONCE DAILY   sildenafil (REVATIO) 20 MG tablet Take 20 mg by mouth.   tadalafil (CIALIS) 20 MG tablet Take 20 mg by mouth every morning.   Testosterone (TESTOPEL) 75 MG PLLT    valACYclovir (VALTREX) 1000 MG  tablet Take 1 tablet (1,000 mg total) by mouth daily as needed (Use for 5 days in a row at onset of cold sore).   [DISCONTINUED] celecoxib  (CELEBREX ) 200 MG capsule TAKE 1 CAPSULE BY MOUTH ONCE DAILY as needed for joint pain. Take WITH FOOD. DO not take with other nsaids.  [2]  Allergies Allergen Reactions   Nutritional Supplements Hives   Red Dye    Red Dye #40 (Allura Red) Other (See Comments)   Milk (Cow) Other (See Comments)    Other Reaction: GI Upset  Other reaction(s): Other (See Comments)  Other Reaction: GI Upset  Other Reaction: GI Upset    Other reaction(s): Other (See Comments) Other Reaction: GI Upset  [3]  Social History Tobacco Use   Smoking status: Never   Smokeless tobacco: Never  Vaping Use   Vaping status: Never Used  Substance Use Topics   Alcohol use: Not Currently   Drug use: No

## 2024-04-02 ENCOUNTER — Ambulatory Visit: Payer: 59 | Admitting: Dermatology

## 2024-04-02 ENCOUNTER — Encounter: Payer: Self-pay | Admitting: Dermatology

## 2024-04-02 DIAGNOSIS — L821 Other seborrheic keratosis: Secondary | ICD-10-CM

## 2024-04-02 DIAGNOSIS — L859 Epidermal thickening, unspecified: Secondary | ICD-10-CM | POA: Diagnosis not present

## 2024-04-02 DIAGNOSIS — Z85828 Personal history of other malignant neoplasm of skin: Secondary | ICD-10-CM | POA: Diagnosis not present

## 2024-04-02 DIAGNOSIS — D1801 Hemangioma of skin and subcutaneous tissue: Secondary | ICD-10-CM | POA: Diagnosis not present

## 2024-04-02 DIAGNOSIS — L570 Actinic keratosis: Secondary | ICD-10-CM | POA: Diagnosis not present

## 2024-04-02 DIAGNOSIS — L905 Scar conditions and fibrosis of skin: Secondary | ICD-10-CM | POA: Diagnosis not present

## 2024-04-02 DIAGNOSIS — D229 Melanocytic nevi, unspecified: Secondary | ICD-10-CM

## 2024-04-02 DIAGNOSIS — Z86018 Personal history of other benign neoplasm: Secondary | ICD-10-CM

## 2024-04-02 DIAGNOSIS — L820 Inflamed seborrheic keratosis: Secondary | ICD-10-CM

## 2024-04-02 DIAGNOSIS — W908XXA Exposure to other nonionizing radiation, initial encounter: Secondary | ICD-10-CM

## 2024-04-02 DIAGNOSIS — D489 Neoplasm of uncertain behavior, unspecified: Secondary | ICD-10-CM | POA: Diagnosis not present

## 2024-04-02 DIAGNOSIS — L578 Other skin changes due to chronic exposure to nonionizing radiation: Secondary | ICD-10-CM

## 2024-04-02 DIAGNOSIS — Z1283 Encounter for screening for malignant neoplasm of skin: Secondary | ICD-10-CM

## 2024-04-02 DIAGNOSIS — L814 Other melanin hyperpigmentation: Secondary | ICD-10-CM | POA: Diagnosis not present

## 2024-04-02 NOTE — Progress Notes (Unsigned)
 Follow-Up Visit   Subjective  Javier CREMER Sr. is a 63 y.o. male who presents for the following: Skin Cancer Screening and Full Body Skin Exam Hx bcc hx of dysplastic moles Hx of aks and isks   The patient presents for Total-Body Skin Exam (TBSE) for skin cancer screening and mole check. The patient has spots, moles and lesions to be evaluated, some may be new or changing and the patient may have concern these could be cancer.  The following portions of the chart were reviewed this encounter and updated as appropriate: medications, allergies, medical history  Review of Systems:  No other skin or systemic complaints except as noted in HPI or Assessment and Plan.  Objective  Well appearing patient in no apparent distress; mood and affect are within normal limits.  A full examination was performed including scalp, head, eyes, ears, nose, lips, neck, chest, axillae, abdomen, back, buttocks, bilateral upper extremities, bilateral lower extremities, hands, feet, fingers, toes, fingernails, and toenails. All findings within normal limits unless otherwise noted below.   Relevant physical exam findings are noted in the Assessment and Plan. Generalized Erythematous thin papules/macules with gritty scale.  right midline superior forehead near hairline x 1; scalp x 1 (2) Erythematous thin papules/macules with gritty scale.   right tricep near elbow 1.2 cm indurated flat papule   left scapula x 1, right clavicle x 1 (2) Erythematous stuck-on, waxy papule or plaque  Assessment & Plan   HISTORY OF BASAL CELL CARCINOMA OF THE SKIN 03/07/2022 right inferior cheek - ED&C  - No evidence of recurrence today - Recommend regular full body skin exams - Recommend daily broad spectrum sunscreen SPF 30+ to sun-exposed areas, reapply every 2 hours as needed.  - Call if any new or changing lesions are noted between office visits  HISTORY OF DYSPLASTIC NEVUS 02/13/2019 left low back 11 cm lat to  spine - mild atypia  No evidence of recurrence today Recommend regular full body skin exams Recommend daily broad spectrum sunscreen SPF 30+ to sun-exposed areas, reapply every 2 hours as needed.  Call if any new or changing lesions are noted between office visits  SKIN CANCER SCREENING PERFORMED TODAY.  ACTINIC DAMAGE - Chronic condition, secondary to cumulative UV/sun exposure - diffuse scaly erythematous macules with underlying dyspigmentation - Recommend daily broad spectrum sunscreen SPF 30+ to sun-exposed areas, reapply every 2 hours as needed.  - Staying in the shade or wearing long sleeves, sun glasses (UVA+UVB protection) and wide brim hats (4-inch brim around the entire circumference of the hat) are also recommended for sun protection.  - Call for new or changing lesions.  LENTIGINES, SEBORRHEIC KERATOSES, HEMANGIOMAS - Benign normal skin lesions - Benign-appearing - Call for any changes  MELANOCYTIC NEVI - Tan-brown and/or pink-flesh-colored symmetric macules and papules - Benign appearing on exam today - Observation - Call clinic for new or changing moles - Recommend daily use of broad spectrum spf 30+ sunscreen to sun-exposed areas.   ACTINIC KERATOSIS (2) right midline superior forehead near hairline x 1; scalp x 1 (2) Discussed will recheck right midline superior forehead near hairline at next follow up if unresolved will consider bx   Actinic keratoses are precancerous spots that appear secondary to cumulative UV radiation exposure/sun exposure over time. They are chronic with expected duration over 1 year. A portion of actinic keratoses will progress to squamous cell carcinoma of the skin. It is not possible to reliably predict which spots will progress to skin  cancer and so treatment is recommended to prevent development of skin cancer.  Recommend daily broad spectrum sunscreen SPF 30+ to sun-exposed areas, reapply every 2 hours as needed.  Recommend staying in the  shade or wearing long sleeves, sun glasses (UVA+UVB protection) and wide brim hats (4-inch brim around the entire circumference of the hat). Call for new or changing lesions. - Destruction of lesion - right midline superior forehead near hairline x 1; scalp x 1 (2) Complexity: simple   Destruction method: cryotherapy   Informed consent: discussed and consent obtained   Timeout:  patient name, date of birth, surgical site, and procedure verified Lesion destroyed using liquid nitrogen: Yes   Region frozen until ice ball extended beyond lesion: Yes   Outcome: patient tolerated procedure well with no complications   Post-procedure details: wound care instructions given    NEOPLASM OF UNCERTAIN BEHAVIOR right tricep near elbow - Epidermal / dermal shaving  Lesion diameter (cm):  0.8 Informed consent: discussed and consent obtained   Timeout: patient name, date of birth, surgical site, and procedure verified   Procedure prep:  Patient was prepped and draped in usual sterile fashion Prep type:  Isopropyl alcohol Anesthesia: the lesion was anesthetized in a standard fashion   Anesthetic:  1% lidocaine  w/ epinephrine 1-100,000 buffered w/ 8.4% NaHCO3 Instrument used: flexible razor blade   Hemostasis achieved with: pressure, aluminum chloride and electrodesiccation   Outcome: patient tolerated procedure well   Post-procedure details: sterile dressing applied and wound care instructions given   Dressing type: bandage and petrolatum    - Destruction of lesion Complexity: extensive   Destruction method: electrodesiccation and curettage   Informed consent: discussed and consent obtained   Timeout:  patient name, date of birth, surgical site, and procedure verified Procedure prep:  Patient was prepped and draped in usual sterile fashion Prep type:  Isopropyl alcohol Anesthesia: the lesion was anesthetized in a standard fashion   Anesthetic:  1% lidocaine  w/ epinephrine 1-100,000 buffered w/  8.4% NaHCO3 Curettage performed in three different directions: Yes   Electrodesiccation performed over the curetted area: Yes   Lesion length (cm):  1.2 Lesion width (cm):  1.2 Margin per side (cm):  0.2 Final wound size (cm):  1.6 Hemostasis achieved with:  pressure, aluminum chloride and electrodesiccation Outcome: patient tolerated procedure well with no complications   Post-procedure details: sterile dressing applied and wound care instructions given   Dressing type: bandage and petrolatum    Specimen 1 - Surgical pathology Differential Diagnosis: isk r/o ca  ED&C  Check Margins: No Isk r/o ca  ED&C today  INFLAMED SEBORRHEIC KERATOSIS (2) left scapula x 1, right clavicle x 1 (2) Symptomatic, irritating, patient would like treated. - Destruction of lesion - left scapula x 1, right clavicle x 1 (2) Complexity: simple   Destruction method: cryotherapy   Informed consent: discussed and consent obtained   Timeout:  patient name, date of birth, surgical site, and procedure verified Lesion destroyed using liquid nitrogen: Yes   Region frozen until ice ball extended beyond lesion: Yes   Outcome: patient tolerated procedure well with no complications   Post-procedure details: wound care instructions given    SKIN CANCER SCREENING   ACTINIC SKIN DAMAGE   LENTIGO   MELANOCYTIC NEVUS, UNSPECIFIED LOCATION   HISTORY OF DYSPLASTIC NEVUS   HISTORY OF BASAL CELL CARCINOMA   Return for 6 - 8 month recheck ak and tbse.  I, Eleanor Blush, CMA, am acting as scribe for [employer]  Hester, MD.   Documentation: I have reviewed the above documentation for accuracy and completeness, and I agree with the above.  Alm Hester, MD

## 2024-04-02 NOTE — Patient Instructions (Addendum)
 Electrodesiccation and Curettage ("Scrape and Burn") Wound Care Instructions  Leave the original bandage on for 24 hours if possible.  If the bandage becomes soaked or soiled before that time, it is OK to remove it and examine the wound.  A small amount of post-operative bleeding is normal.  If excessive bleeding occurs, remove the bandage, place gauze over the site and apply continuous pressure (no peeking) over the area for 30 minutes. If this does not work, please call our clinic as soon as possible or page your doctor if it is after hours.   Once a day, cleanse the wound with soap and water . It is fine to shower. If a thick crust develops you may use a Q-tip dipped into dilute hydrogen peroxide (mix 1:1 with water ) to dissolve it.  Hydrogen peroxide can slow the healing process, so use it only as needed.    After washing, apply petroleum jelly (Vaseline) or an antibiotic ointment if your doctor prescribed one for you, followed by a bandage.    For best healing, the wound should be covered with a layer of ointment at all times. If you are not able to keep the area covered with a bandage to hold the ointment in place, this may mean re-applying the ointment several times a day.  Continue this wound care until the wound has healed and is no longer open. It may take several weeks for the wound to heal and close.  Itching and mild discomfort is normal during the healing process.  If you have any discomfort, you can take Tylenol  (acetaminophen ) or ibuprofen as directed on the bottle. (Please do not take these if you have an allergy to them or cannot take them for another reason).  Some redness, tenderness and white or yellow material in the wound is normal healing.  If the area becomes very sore and red, or develops a thick yellow-green material (pus), it may be infected; please notify us .    Wound healing continues for up to one year following surgery. It is not unusual to experience pain in the scar  from time to time during the interval.  If the pain becomes severe or the scar thickens, you should notify the office.    A slight amount of redness in a scar is expected for the first six months.  After six months, the redness will fade and the scar will soften and fade.  The color difference becomes less noticeable with time.  If there are any problems, return for a post-op surgery check at your earliest convenience.  To improve the appearance of the scar, you can use silicone scar gel, cream, or sheets (such as Mederma or Serica) every night for up to one year. These are available over the counter (without a prescription).  Please call our office at [phone] for any questions or concerns.   Biopsy Wound Care Instructions  Leave the original bandage on for 24 hours if possible.  If the bandage becomes soaked or soiled before that time, it is OK to remove it and examine the wound.  A small amount of post-operative bleeding is normal.  If excessive bleeding occurs, remove the bandage, place gauze over the site and apply continuous pressure (no peeking) over the area for 30 minutes. If this does not work, please call our clinic as soon as possible or page your doctor if it is after hours.   Once a day, cleanse the wound with soap and water . It is fine to shower.  If a thick crust develops you may use a Q-tip dipped into dilute hydrogen peroxide (mix 1:1 with water ) to dissolve it.  Hydrogen peroxide can slow the healing process, so use it only as needed.    After washing, apply petroleum jelly (Vaseline) or an antibiotic ointment if your doctor prescribed one for you, followed by a bandage.    For best healing, the wound should be covered with a layer of ointment at all times. If you are not able to keep the area covered with a bandage to hold the ointment in place, this may mean re-applying the ointment several times a day.  Continue this wound care until the wound has healed and is no longer  open.   Itching and mild discomfort is normal during the healing process. However, if you develop pain or severe itching, please call our office.   If you have any discomfort, you can take Tylenol  (acetaminophen ) or ibuprofen as directed on the bottle. (Please do not take these if you have an allergy to them or cannot take them for another reason).  Some redness, tenderness and white or yellow material in the wound is normal healing.  If the area becomes very sore and red, or develops a thick yellow-green material (pus), it may be infected; please notify us .    If you have stitches, return to clinic as directed to have the stitches removed. You will continue wound care for 2-3 days after the stitches are removed.   Wound healing continues for up to one year following surgery. It is not unusual to experience pain in the scar from time to time during the interval.  If the pain becomes severe or the scar thickens, you should notify the office.    A slight amount of redness in a scar is expected for the first six months.  After six months, the redness will fade and the scar will soften and fade.  The color difference becomes less noticeable with time.  If there are any problems, return for a post-op surgery check at your earliest convenience.  To improve the appearance of the scar, you can use silicone scar gel, cream, or sheets (such as Mederma or Serica) every night for up to one year. These are available over the counter (without a prescription).  Please call our office at [phone] for any questions or concerns.      Actinic keratoses are precancerous spots that appear secondary to cumulative UV radiation exposure/sun exposure over time. They are chronic with expected duration over 1 year. A portion of actinic keratoses will progress to squamous cell carcinoma of the skin. It is not possible to reliably predict which spots will progress to skin cancer and so treatment is recommended to  prevent development of skin cancer.  Recommend daily broad spectrum sunscreen SPF 30+ to sun-exposed areas, reapply every 2 hours as needed.  Recommend staying in the shade or wearing long sleeves, sun glasses (UVA+UVB protection) and wide brim hats (4-inch brim around the entire circumference of the hat). Call for new or changing lesions.   Cryotherapy Aftercare  Wash gently with soap and water  everyday.   Apply Vaseline and Band-Aid daily until healed.   Seborrheic Keratosis  What causes seborrheic keratoses? Seborrheic keratoses are harmless, common skin growths that first appear during adult life.  As time goes by, more growths appear.  Some people may develop a large number of them.  Seborrheic keratoses appear on both covered and uncovered body parts.  They  are not caused by sunlight.  The tendency to develop seborrheic keratoses can be inherited.  They vary in color from skin-colored to gray, brown, or even black.  They can be either smooth or have a rough, warty surface.   Seborrheic keratoses are superficial and look as if they were stuck on the skin.  Under the microscope this type of keratosis looks like layers upon layers of skin.  That is why at times the top layer may seem to fall off, but the rest of the growth remains and re-grows.    Treatment Seborrheic keratoses do not need to be treated, but can easily be removed in the office.  Seborrheic keratoses often cause symptoms when they rub on clothing or jewelry.  Lesions can be in the way of shaving.  If they become inflamed, they can cause itching, soreness, or burning.  Removal of a seborrheic keratosis can be accomplished by freezing, burning, or surgery. If any spot bleeds, scabs, or grows rapidly, please return to have it checked, as these can be an indication of a skin cancer.    Melanoma ABCDEs  Melanoma is the most dangerous type of skin cancer, and is the leading cause of death from skin disease.  You are more likely  to develop melanoma if you: Have light-colored skin, light-colored eyes, or red or blond hair Spend a lot of time in the sun Tan regularly, either outdoors or in a tanning bed Have had blistering sunburns, especially during childhood Have a close family member who has had a melanoma Have atypical moles or large birthmarks  Early detection of melanoma is key since treatment is typically straightforward and cure rates are extremely high if we catch it early.   The first sign of melanoma is often a change in a mole or a new dark spot.  The ABCDE system is a way of remembering the signs of melanoma.  A for asymmetry:  The two halves do not match. B for border:  The edges of the growth are irregular. C for color:  A mixture of colors are present instead of an even brown color. D for diameter:  Melanomas are usually (but not always) greater than 6mm - the size of a pencil eraser. E for evolution:  The spot keeps changing in size, shape, and color.  Please check your skin once per month between visits. You can use a small mirror in front and a large mirror behind you to keep an eye on the back side or your body.   If you see any new or changing lesions before your next follow-up, please call to schedule a visit.  Please continue daily skin protection including broad spectrum sunscreen SPF 30+ to sun-exposed areas, reapplying every 2 hours as needed when you're outdoors.   Staying in the shade or wearing long sleeves, sun glasses (UVA+UVB protection) and wide brim hats (4-inch brim around the entire circumference of the hat) are also recommended for sun protection.    Due to recent changes in healthcare laws, you may see results of your pathology and/or laboratory studies on MyChart before the doctors have had a chance to review them. We understand that in some cases there may be results that are confusing or concerning to you. Please understand that not all results are received at the same time  and often the doctors may need to interpret multiple results in order to provide you with the best plan of care or course of treatment. Therefore, we ask  that you please give us  2 business days to thoroughly review all your results before contacting the office for clarification. Should we see a critical lab result, you will be contacted sooner.   If You Need Anything After Your Visit  If you have any questions or concerns for your doctor, please call our main line at [phone] and press option 4 to reach your doctor's medical assistant. If no one answers, please leave a voicemail as directed and we will return your call as soon as possible. Messages left after 4 pm will be answered the following business day.   You may also send us  a message via MyChart. We typically respond to MyChart messages within 1-2 business days.  For prescription refills, please ask your pharmacy to contact our office. Our fax number is [phone].  If you have an urgent issue when the clinic is closed that cannot wait until the next business day, you can page your doctor at the number below.    Please note that while we do our best to be available for urgent issues outside of office hours, we are not available 24/7.   If you have an urgent issue and are unable to reach us , you may choose to seek medical care at your doctor's office, retail clinic, urgent care center, or emergency room.  If you have a medical emergency, please immediately call 911 or go to the emergency department.  Pager Numbers  - Dr. Hester: [phone]  - Dr. Jackquline: [phone]  - Dr. Claudene: [phone]   - Dr. Raymund: [phone]  In the event of inclement weather, please call our main line at [phone] for an update on the status of any delays or closures.  Dermatology Medication Tips: Please keep the boxes that topical medications come in in order to help keep track of the instructions about where and how to use these.  Pharmacies typically print the medication instructions only on the boxes and not directly on the medication tubes.   If your medication is too expensive, please contact our office at [phone] option 4 or send us  a message through MyChart.   We are unable to tell what your co-pay for medications will be in advance as this is different depending on your insurance coverage. However, we may be able to find a substitute medication at lower cost or fill out paperwork to get insurance to cover a needed medication.   If a prior authorization is required to get your medication covered by your insurance company, please allow us  1-2 business days to complete this process.  Drug prices often vary depending on where the prescription is filled and some pharmacies may offer cheaper prices.  The website www.goodrx.com contains coupons for medications through different pharmacies. The prices here do not account for what the cost may be with help from insurance (it may be cheaper with your insurance), but the website can give you the price if you did not use any insurance.  - You can print the associated coupon and take it with your prescription to the pharmacy.  - You may also stop by our office during regular business hours and pick up a GoodRx coupon card.  - If you need your prescription sent electronically to a different pharmacy, notify our office through [facility] or by phone at [phone] option 4.     Si Usted Necesita Algo Despus de Su Visita  Tambin puede enviarnos un mensaje a travs de Clinical Cytogeneticist. Por lo general respondemos a los  mensajes de MyChart en el transcurso de 1 a 2 das hbiles.  Para renovar recetas, por favor pida a su farmacia que se ponga en contacto con nuestra oficina. Randi lakes de fax es [place] [phone].  Si tiene un asunto urgente cuando la clnica est cerrada y que no puede esperar hasta el siguiente da hbil, puede llamar/localizar a su doctor(a) al nmero  que aparece a continuacin.   Por favor, tenga en cuenta que aunque hacemos todo lo posible para estar disponibles para asuntos urgentes fuera del horario de [place], no estamos disponibles las 24 horas del da, los 7 [address]  [phone] de la [place].   Si tiene un problema urgente y no puede comunicarse con nosotros, puede optar por buscar atencin mdica  en el consultorio de su doctor(a), en una clnica privada, en un centro de atencin urgente o en una sala de emergencias.  Si tiene engineer, drilling, por favor llame inmediatamente al 911 o vaya a la sala de emergencias.  Nmeros de bper  - Dr. Hester: [phone]  - Dra. Jackquline: [phone]  - Dr. Claudene: [phone]  - Dra. Kitts: [phone]  En caso de inclemencias del [place], por favor llame a nuestra lnea principal al [phone] para una actualizacin sobre el estado de cualquier retraso o cierre.  Consejos para la medicacin en dermatologa: Por favor, guarde las cajas en las que vienen los medicamentos de uso tpico para ayudarle a seguir las instrucciones sobre dnde y cmo usarlos. Las farmacias generalmente imprimen las instrucciones del medicamento slo en las cajas y no directamente en los tubos del [place].   Si su medicamento es muy caro, por favor, pngase en contacto con landry rieger llamando al [phone] y presione la opcin 4 o envenos un mensaje a travs de Clinical Cytogeneticist.   No podemos decirle cul ser su copago por los medicamentos por adelantado ya que esto es diferente dependiendo de la cobertura de su seguro. Sin embargo, es posible que podamos encontrar un medicamento sustituto a audiological scientist un formulario para que el seguro cubra el medicamento que se considera necesario.   Si se requiere una autorizacin previa para que su compaa de seguros cubra su medicamento, por favor permtanos de 1 a 2 das hbiles para completar este proceso.  Los precios de los medicamentos varan con frecuencia  dependiendo del environmental consultant de dnde se surte la receta y alguna farmacias pueden ofrecer precios ms baratos.  El sitio web www.goodrx.com tiene cupones para medicamentos de health and safety inspector. Los precios aqu no tienen en cuenta lo que podra costar con la ayuda del seguro (puede ser ms barato con su seguro), pero el sitio web puede darle el precio si no utiliz tourist information centre manager.  - Puede imprimir el cupn correspondiente y llevarlo con su receta a la farmacia.  - Tambin puede pasar por nuestra oficina durante el horario de atencin regular y education officer, museum una tarjeta de cupones de GoodRx.  - Si necesita que su receta se enve electrnicamente a una farmacia diferente, informe a nuestra oficina a travs de MyChart de [place] o por telfono llamando al [phone] y presione la opcin 4.

## 2024-04-04 LAB — SURGICAL PATHOLOGY

## 2024-04-07 ENCOUNTER — Ambulatory Visit: Payer: Self-pay | Admitting: Dermatology

## 2024-04-07 NOTE — Telephone Encounter (Signed)
 Left pt msg to call for bx result/sh

## 2024-04-07 NOTE — Telephone Encounter (Signed)
-----   Message from Alm Rhyme, MD sent at 04/07/2024  4:03 PM EST ----- FINAL DIAGNOSIS        1. Skin, right tricep near elbow :       EPIDERMAL HYPERPLASIA WITH FIBROSIS, SEE DESCRIPTION    Benign thickened skin with scar tissue No further treatment needed Recheck next visit

## 2024-04-14 NOTE — Telephone Encounter (Addendum)
 Called and discussed results with patient. He verbalized understanding and denied further questions. Will recheck at next follow up.   ----- Message from Alm Rhyme, MD sent at 04/07/2024  4:03 PM EST ----- FINAL DIAGNOSIS        1. Skin, right tricep near elbow :       EPIDERMAL HYPERPLASIA WITH FIBROSIS, SEE DESCRIPTION    Benign thickened skin with scar tissue No further treatment needed Recheck next visit

## 2024-04-27 ENCOUNTER — Other Ambulatory Visit: Payer: Self-pay | Admitting: Physician Assistant

## 2024-04-27 DIAGNOSIS — K580 Irritable bowel syndrome with diarrhea: Secondary | ICD-10-CM

## 2024-04-27 DIAGNOSIS — M159 Polyosteoarthritis, unspecified: Secondary | ICD-10-CM

## 2024-04-28 NOTE — Telephone Encounter (Signed)
 Requested Prescriptions  Pending Prescriptions Disp Refills   dicyclomine  (BENTYL ) 20 MG tablet [Pharmacy Med Name: dicyclomine  20 mg tablet] 360 tablet 0    Sig: TAKE 1 TABLET BY MOUTH EVERY 6 HOURS AS NEEDED     Gastroenterology:  Antispasmodic Agents Passed - 04/28/2024  5:08 PM      Passed - Valid encounter within last 12 months    Recent Outpatient Visits           4 weeks ago Rheumatoid arthritis of multiple sites with negative rheumatoid factor (HCC)   [facility], Toribio SQUIBB, [place]   4 months ago Sarcoidosis   [facility] at [facility], Toribio [place], [place]   6 months ago Irritable bowel syndrome with diarrhea   [facility] at [facility], Toribio SQUIBB, [place]   8 months ago Irritable bowel syndrome with diarrhea   [facility] at [facility], Toribio SQUIBB, [place]       Future Appointments             In 6 months Hester Alm BROCKS, MD [facility]            Refused Prescriptions Disp Refills   celecoxib  (CELEBREX ) 200 MG capsule [Pharmacy Med Name: celecoxib  200 mg capsule] 60 capsule 1    Sig: TAKE 1 CAPSULE BY MOUTH ONCE DAILY as needed FOR joint pain. Take WITH FOOD. DO not take with other nsaids SUCH AS MELOXICAM     Analgesics:  COX2 Inhibitors Failed - 04/28/2024  5:08 PM      Failed - Manual Review: Labs are only required if the patient has taken medication for more than 8 weeks.      Passed - HGB in normal range and within 360 days    Hemoglobin  Date Value Ref Range Status  12/07/2023 15.1 13.0 - 17.7 g/dL Final         Passed - Cr in normal range and within 360 days    Creatinine, Ser  Date Value Ref Range Status  12/07/2023 1.19 0.76 - 1.27 mg/dL Final         Passed - HCT in normal range and within 360 days    Hematocrit  Date Value Ref Range Status  12/07/2023  47.2 37.5 - 51.0 % Final         Passed - AST in normal range and within 360 days    AST  Date Value Ref Range Status  12/07/2023 23 0 - 40 IU/L Final         Passed - ALT in normal range and within 360 days    ALT  Date Value Ref Range Status  12/07/2023 23 0 - 44 IU/L Final         Passed - eGFR is 30 or above and within 360 days    eGFR  Date Value Ref Range Status  12/07/2023 68 >59 mL/min/1.73 Final         Passed - Patient is not pregnant      Passed - Valid encounter within last 12 months    Recent Outpatient Visits           4 weeks ago Rheumatoid arthritis of multiple sites with negative rheumatoid factor [facility])   [facility] at [facility], Toribio SQUIBB, [place]  4 months ago Sarcoidosis   [facility] at [facility], Toribio SQUIBB, [place]   6 months ago Irritable bowel syndrome with diarrhea   [facility] at [facility], Toribio SQUIBB, [place]   8 months ago Irritable bowel syndrome with diarrhea   [facility] at [facility], Toribio SQUIBB, [place]       Future Appointments             In 6 months Hester Alm BROCKS, MD [facility]

## 2024-04-29 ENCOUNTER — Other Ambulatory Visit

## 2024-04-29 DIAGNOSIS — Z006 Encounter for examination for normal comparison and control in clinical research program: Secondary | ICD-10-CM

## 2024-05-09 LAB — GENECONNECT MOLECULAR SCREEN: Genetic Analysis Overall Interpretation: NEGATIVE

## 2024-05-10 ENCOUNTER — Other Ambulatory Visit: Payer: Self-pay | Admitting: Physician Assistant

## 2024-05-12 NOTE — Telephone Encounter (Signed)
 Requested Prescriptions  Pending Prescriptions Disp Refills   chlorthalidone (HYGROTON) 25 MG tablet [Pharmacy Med Name: chlorthalidone 25 mg tablet] 90 tablet 0    Sig: take 1 tablet by mouth every morning     Cardiovascular: Diuretics - Thiazide Passed - 05/12/2024 10:43 AM      Passed - Cr in normal range and within 180 days    Creatinine, Ser  Date Value Ref Range Status  12/07/2023 1.19 0.76 - 1.27 mg/dL Final         Passed - K in normal range and within 180 days    Potassium  Date Value Ref Range Status  12/07/2023 4.0 3.5 - 5.2 mmol/L Final         Passed - Na in normal range and within 180 days    Sodium  Date Value Ref Range Status  12/07/2023 138 134 - 144 mmol/L Final         Passed - Last BP in normal range    BP Readings from Last 1 Encounters:  03/31/24 124/78         Passed - Valid encounter within last 6 months    Recent Outpatient Visits           1 month ago Rheumatoid arthritis of multiple sites with negative rheumatoid factor (HCC)   [place], Toribio SQUIBB, [place]   5 months ago Sarcoidosis   [facility] at [facility], Toribio SQUIBB, [place]   7 months ago Irritable bowel syndrome with diarrhea   [facility] at [facility], Toribio [place]QU[place]   9 months ago Irritable bowel syndrome with diarrhea   [facility] at [facility], Toribio SQUIBB, [place]       Future Appointments             In 5 months Hester Alm BROCKS, MD [facility]

## 2024-07-30 ENCOUNTER — Encounter: Admitting: Physician Assistant

## 2024-11-04 ENCOUNTER — Ambulatory Visit: Admitting: Dermatology
